# Patient Record
Sex: Male | Born: 1945 | Race: Black or African American | Hispanic: No | Marital: Married | Smoking: Never smoker
Health system: Southern US, Community
[De-identification: ages and names within clinical notes are randomized; demographics above are authoritative.]

## PROBLEM LIST (undated history)

## (undated) DIAGNOSIS — M6282 Rhabdomyolysis: Secondary | ICD-10-CM

## (undated) DIAGNOSIS — E785 Hyperlipidemia, unspecified: Secondary | ICD-10-CM

## (undated) DIAGNOSIS — I639 Cerebral infarction, unspecified: Secondary | ICD-10-CM

## (undated) DIAGNOSIS — I10 Essential (primary) hypertension: Secondary | ICD-10-CM

## (undated) DIAGNOSIS — K5730 Diverticulosis of large intestine without perforation or abscess without bleeding: Secondary | ICD-10-CM

## (undated) HISTORY — DX: Cerebral infarction, unspecified: I63.9

## (undated) HISTORY — DX: Essential (primary) hypertension: I10

## (undated) HISTORY — DX: Rhabdomyolysis: M62.82

## (undated) HISTORY — DX: Diverticulosis of large intestine without perforation or abscess without bleeding: K57.30

## (undated) HISTORY — DX: Hyperlipidemia, unspecified: E78.5

## (undated) HISTORY — PX: OTHER SURGICAL HISTORY: SHX169

---

## 2002-09-01 ENCOUNTER — Encounter: Payer: Self-pay | Admitting: Internal Medicine

## 2002-09-02 ENCOUNTER — Inpatient Hospital Stay (HOSPITAL_COMMUNITY): Admission: RE | Admit: 2002-09-02 | Discharge: 2002-09-03 | Payer: Self-pay | Admitting: Internal Medicine

## 2002-09-19 ENCOUNTER — Ambulatory Visit: Admission: RE | Admit: 2002-09-19 | Discharge: 2002-09-19 | Payer: Self-pay | Admitting: Internal Medicine

## 2002-09-19 ENCOUNTER — Encounter: Payer: Self-pay | Admitting: Cardiology

## 2002-09-20 ENCOUNTER — Ambulatory Visit (HOSPITAL_COMMUNITY): Admission: RE | Admit: 2002-09-20 | Discharge: 2002-09-20 | Payer: Self-pay | Admitting: Internal Medicine

## 2004-02-21 ENCOUNTER — Ambulatory Visit: Payer: Self-pay | Admitting: Internal Medicine

## 2004-03-24 ENCOUNTER — Ambulatory Visit: Payer: Self-pay | Admitting: Internal Medicine

## 2004-03-31 ENCOUNTER — Ambulatory Visit: Payer: Self-pay | Admitting: Internal Medicine

## 2004-08-04 ENCOUNTER — Ambulatory Visit: Payer: Self-pay | Admitting: Internal Medicine

## 2004-08-29 ENCOUNTER — Ambulatory Visit: Payer: Self-pay | Admitting: Internal Medicine

## 2004-09-26 ENCOUNTER — Ambulatory Visit: Payer: Self-pay | Admitting: Internal Medicine

## 2004-09-26 ENCOUNTER — Encounter: Payer: Self-pay | Admitting: Internal Medicine

## 2004-11-07 ENCOUNTER — Ambulatory Visit: Payer: Self-pay | Admitting: Internal Medicine

## 2004-11-26 ENCOUNTER — Ambulatory Visit: Payer: Self-pay | Admitting: Internal Medicine

## 2005-01-20 ENCOUNTER — Ambulatory Visit (HOSPITAL_COMMUNITY): Admission: RE | Admit: 2005-01-20 | Discharge: 2005-01-20 | Payer: Self-pay | Admitting: Dermatology

## 2005-01-29 ENCOUNTER — Encounter (HOSPITAL_BASED_OUTPATIENT_CLINIC_OR_DEPARTMENT_OTHER): Admission: RE | Admit: 2005-01-29 | Discharge: 2005-04-24 | Payer: Self-pay | Admitting: Surgery

## 2005-04-09 ENCOUNTER — Ambulatory Visit: Payer: Self-pay | Admitting: Internal Medicine

## 2005-04-10 ENCOUNTER — Ambulatory Visit: Payer: Self-pay | Admitting: Internal Medicine

## 2005-04-10 ENCOUNTER — Encounter: Admission: RE | Admit: 2005-04-10 | Discharge: 2005-04-10 | Payer: Self-pay | Admitting: Internal Medicine

## 2005-04-21 ENCOUNTER — Ambulatory Visit: Payer: Self-pay | Admitting: Internal Medicine

## 2005-05-01 ENCOUNTER — Encounter (HOSPITAL_BASED_OUTPATIENT_CLINIC_OR_DEPARTMENT_OTHER): Admission: RE | Admit: 2005-05-01 | Discharge: 2005-07-06 | Payer: Self-pay | Admitting: Surgery

## 2005-07-15 ENCOUNTER — Ambulatory Visit: Payer: Self-pay | Admitting: Internal Medicine

## 2005-07-24 ENCOUNTER — Ambulatory Visit (HOSPITAL_COMMUNITY): Admission: RE | Admit: 2005-07-24 | Discharge: 2005-07-24 | Payer: Self-pay | Admitting: Dermatology

## 2005-08-05 ENCOUNTER — Encounter (HOSPITAL_BASED_OUTPATIENT_CLINIC_OR_DEPARTMENT_OTHER): Admission: RE | Admit: 2005-08-05 | Discharge: 2005-10-19 | Payer: Self-pay | Admitting: Internal Medicine

## 2005-10-06 ENCOUNTER — Ambulatory Visit: Payer: Self-pay | Admitting: Internal Medicine

## 2005-10-06 ENCOUNTER — Inpatient Hospital Stay (HOSPITAL_COMMUNITY): Admission: AD | Admit: 2005-10-06 | Discharge: 2005-10-09 | Payer: Self-pay | Admitting: Internal Medicine

## 2005-10-07 ENCOUNTER — Encounter: Payer: Self-pay | Admitting: Vascular Surgery

## 2005-11-19 ENCOUNTER — Observation Stay (HOSPITAL_COMMUNITY): Admission: RE | Admit: 2005-11-19 | Discharge: 2005-11-20 | Payer: Self-pay | Admitting: Orthopedic Surgery

## 2006-11-02 ENCOUNTER — Ambulatory Visit: Payer: Self-pay | Admitting: Internal Medicine

## 2006-11-03 LAB — CONVERTED CEMR LAB
ALT: 27 units/L (ref 0–53)
AST: 38 units/L — ABNORMAL HIGH (ref 0–37)
Albumin: 4.8 g/dL (ref 3.5–5.2)
Alkaline Phosphatase: 97 units/L (ref 39–117)
BUN: 31 mg/dL — ABNORMAL HIGH (ref 6–23)
Basophils Absolute: 0 10*3/uL (ref 0.0–0.1)
Basophils Relative: 1 % (ref 0.0–1.0)
Bilirubin, Direct: 0.1 mg/dL (ref 0.0–0.3)
CO2: 26 meq/L (ref 19–32)
Calcium: 9.4 mg/dL (ref 8.4–10.5)
Chloride: 102 meq/L (ref 96–112)
Cholesterol: 134 mg/dL (ref 0–200)
Creatinine, Ser: 1.8 mg/dL — ABNORMAL HIGH (ref 0.4–1.5)
Eosinophils Absolute: 0.3 10*3/uL (ref 0.0–0.6)
Eosinophils Relative: 5.7 % — ABNORMAL HIGH (ref 0.0–5.0)
GFR calc Af Amer: 50 mL/min
GFR calc non Af Amer: 41 mL/min
Glucose, Bld: 77 mg/dL (ref 70–99)
HCT: 33.8 % — ABNORMAL LOW (ref 39.0–52.0)
HDL: 54.8 mg/dL (ref >39.0)
Hemoglobin: 11.4 g/dL — ABNORMAL LOW (ref 13.0–17.0)
LDL Cholesterol: 61 mg/dL (ref 0–99)
Lymphocytes Relative: 23.2 % (ref 12.0–46.0)
MCHC: 33.7 g/dL (ref 30.0–36.0)
MCV: 79.7 fL (ref 78.0–100.0)
Monocytes Absolute: 0.4 10*3/uL (ref 0.2–0.7)
Monocytes Relative: 8.6 % (ref 3.0–11.0)
Neutro Abs: 3.1 10*3/uL (ref 1.4–7.7)
Neutrophils Relative %: 61.5 % (ref 43.0–77.0)
Platelets: 313 10*3/uL (ref 150–400)
Potassium: 3.8 meq/L (ref 3.5–5.1)
RBC: 4.24 M/uL (ref 4.22–5.81)
RDW: 16.3 % — ABNORMAL HIGH (ref 11.5–14.6)
Sodium: 140 meq/L (ref 135–145)
Total Bilirubin: 0.7 mg/dL (ref 0.3–1.2)
Total CHOL/HDL Ratio: 2.4
Total Protein: 8.3 g/dL (ref 6.0–8.3)
Triglycerides: 90 mg/dL (ref 0–149)
VLDL: 18 mg/dL (ref 0–40)
WBC: 4.9 10*3/uL (ref 4.5–10.5)

## 2006-12-30 ENCOUNTER — Encounter: Payer: Self-pay | Admitting: Internal Medicine

## 2007-03-07 DIAGNOSIS — E785 Hyperlipidemia, unspecified: Secondary | ICD-10-CM

## 2007-03-07 DIAGNOSIS — Z8673 Personal history of transient ischemic attack (TIA), and cerebral infarction without residual deficits: Secondary | ICD-10-CM

## 2007-03-07 DIAGNOSIS — I10 Essential (primary) hypertension: Secondary | ICD-10-CM

## 2007-03-07 DIAGNOSIS — M109 Gout, unspecified: Secondary | ICD-10-CM

## 2007-07-15 ENCOUNTER — Ambulatory Visit: Payer: Self-pay | Admitting: Internal Medicine

## 2007-07-15 DIAGNOSIS — I872 Venous insufficiency (chronic) (peripheral): Secondary | ICD-10-CM

## 2007-07-27 ENCOUNTER — Encounter: Payer: Self-pay | Admitting: Internal Medicine

## 2007-07-27 ENCOUNTER — Ambulatory Visit: Payer: Self-pay | Admitting: Vascular Surgery

## 2007-08-31 ENCOUNTER — Encounter: Payer: Self-pay | Admitting: Internal Medicine

## 2007-08-31 ENCOUNTER — Ambulatory Visit: Payer: Self-pay | Admitting: Vascular Surgery

## 2007-12-11 ENCOUNTER — Ambulatory Visit: Payer: Self-pay | Admitting: Internal Medicine

## 2007-12-11 ENCOUNTER — Emergency Department (HOSPITAL_COMMUNITY): Admission: EM | Admit: 2007-12-11 | Discharge: 2007-12-11 | Payer: Self-pay | Admitting: Emergency Medicine

## 2007-12-11 ENCOUNTER — Encounter: Payer: Self-pay | Admitting: Internal Medicine

## 2007-12-14 ENCOUNTER — Encounter: Payer: Self-pay | Admitting: Internal Medicine

## 2007-12-28 ENCOUNTER — Ambulatory Visit: Payer: Self-pay | Admitting: Internal Medicine

## 2008-01-10 ENCOUNTER — Telehealth: Payer: Self-pay | Admitting: Internal Medicine

## 2008-02-17 ENCOUNTER — Ambulatory Visit (HOSPITAL_COMMUNITY): Admission: RE | Admit: 2008-02-17 | Discharge: 2008-02-17 | Payer: Self-pay | Admitting: Orthopedic Surgery

## 2009-01-01 ENCOUNTER — Inpatient Hospital Stay (HOSPITAL_COMMUNITY): Admission: EM | Admit: 2009-01-01 | Discharge: 2009-01-11 | Payer: Self-pay | Admitting: Emergency Medicine

## 2009-01-01 ENCOUNTER — Ambulatory Visit: Payer: Self-pay | Admitting: Internal Medicine

## 2009-01-08 ENCOUNTER — Ambulatory Visit: Payer: Self-pay | Admitting: Physical Medicine & Rehabilitation

## 2009-01-23 ENCOUNTER — Telehealth: Payer: Self-pay | Admitting: Internal Medicine

## 2009-01-23 ENCOUNTER — Encounter: Payer: Self-pay | Admitting: Internal Medicine

## 2009-01-25 ENCOUNTER — Ambulatory Visit: Payer: Self-pay | Admitting: Internal Medicine

## 2009-01-28 LAB — CONVERTED CEMR LAB
BUN: 11 mg/dL (ref 6–23)
CO2: 30 meq/L (ref 19–32)
Calcium: 9.1 mg/dL (ref 8.4–10.5)
Chloride: 107 meq/L (ref 96–112)
Cholesterol: 172 mg/dL (ref 0–200)
Creatinine, Ser: 1.4 mg/dL (ref 0.4–1.5)
GFR calc non Af Amer: 65.69 mL/min (ref >60)
Glucose, Bld: 90 mg/dL (ref 70–99)
HDL: 41.2 mg/dL (ref >39.00)
LDL Cholesterol: 116 mg/dL — ABNORMAL HIGH (ref 0–99)
Potassium: 3.7 meq/L (ref 3.5–5.1)
Sodium: 143 meq/L (ref 135–145)
Total CHOL/HDL Ratio: 4
Triglycerides: 76 mg/dL (ref 0.0–149.0)
Uric Acid, Serum: 9.7 mg/dL — ABNORMAL HIGH (ref 4.0–7.8)
VLDL: 15.2 mg/dL (ref 0.0–40.0)

## 2009-02-25 ENCOUNTER — Ambulatory Visit: Payer: Self-pay | Admitting: Internal Medicine

## 2009-02-26 LAB — CONVERTED CEMR LAB
ALT: 14 units/L (ref 0–53)
AST: 17 units/L (ref 0–37)
Albumin: 4.3 g/dL (ref 3.5–5.2)
Alkaline Phosphatase: 98 units/L (ref 39–117)
BUN: 17 mg/dL (ref 6–23)
Bilirubin, Direct: 0 mg/dL (ref 0.0–0.3)
CO2: 26 meq/L (ref 19–32)
Calcium: 9 mg/dL (ref 8.4–10.5)
Chloride: 104 meq/L (ref 96–112)
Cholesterol: 192 mg/dL (ref 0–200)
Creatinine, Ser: 1.5 mg/dL (ref 0.4–1.5)
GFR calc non Af Amer: 60.65 mL/min (ref >60)
Glucose, Bld: 89 mg/dL (ref 70–99)
HDL: 48.9 mg/dL (ref >39.00)
LDL Cholesterol: 125 mg/dL — ABNORMAL HIGH (ref 0–99)
Potassium: 3.8 meq/L (ref 3.5–5.1)
Sodium: 143 meq/L (ref 135–145)
TSH: 0.76 microintl units/mL (ref 0.35–5.50)
Total Bilirubin: 1.2 mg/dL (ref 0.3–1.2)
Total CHOL/HDL Ratio: 4
Total Protein: 7.6 g/dL (ref 6.0–8.3)
Triglycerides: 90 mg/dL (ref 0.0–149.0)
Uric Acid, Serum: 7.7 mg/dL (ref 4.0–7.8)
VLDL: 18 mg/dL (ref 0.0–40.0)

## 2009-04-24 ENCOUNTER — Ambulatory Visit: Payer: Self-pay | Admitting: Internal Medicine

## 2009-04-30 ENCOUNTER — Telehealth: Payer: Self-pay | Admitting: Internal Medicine

## 2009-04-30 DIAGNOSIS — K5730 Diverticulosis of large intestine without perforation or abscess without bleeding: Secondary | ICD-10-CM | POA: Insufficient documentation

## 2009-04-30 HISTORY — DX: Diverticulosis of large intestine without perforation or abscess without bleeding: K57.30

## 2009-05-08 ENCOUNTER — Ambulatory Visit: Payer: Self-pay | Admitting: Internal Medicine

## 2009-07-04 ENCOUNTER — Ambulatory Visit: Payer: Self-pay | Admitting: Internal Medicine

## 2009-09-26 ENCOUNTER — Encounter (INDEPENDENT_AMBULATORY_CARE_PROVIDER_SITE_OTHER): Payer: Self-pay | Admitting: *Deleted

## 2009-10-15 ENCOUNTER — Encounter: Payer: Self-pay | Admitting: Internal Medicine

## 2009-10-31 ENCOUNTER — Ambulatory Visit: Payer: Self-pay | Admitting: Internal Medicine

## 2009-10-31 DIAGNOSIS — N259 Disorder resulting from impaired renal tubular function, unspecified: Secondary | ICD-10-CM | POA: Insufficient documentation

## 2009-11-25 ENCOUNTER — Encounter (INDEPENDENT_AMBULATORY_CARE_PROVIDER_SITE_OTHER): Payer: Self-pay | Admitting: *Deleted

## 2010-01-15 ENCOUNTER — Ambulatory Visit: Payer: Self-pay | Admitting: Internal Medicine

## 2010-01-15 LAB — CONVERTED CEMR LAB
BUN: 16 mg/dL (ref 6–23)
CO2: 26 meq/L (ref 19–32)
Calcium: 9.3 mg/dL (ref 8.4–10.5)
Chloride: 106 meq/L (ref 96–112)
Creatinine, Ser: 1.3 mg/dL (ref 0.4–1.5)
GFR calc non Af Amer: 73.28 mL/min (ref >60)
Glucose, Bld: 91 mg/dL (ref 70–99)
Potassium: 4 meq/L (ref 3.5–5.1)
Sodium: 140 meq/L (ref 135–145)
Uric Acid, Serum: 6 mg/dL (ref 4.0–7.8)

## 2010-01-24 ENCOUNTER — Ambulatory Visit: Payer: Self-pay | Admitting: Internal Medicine

## 2010-06-10 NOTE — Letter (Signed)
Summary: Colonoscopy Letter  [facility]  [address] [place], [place] [address]   Phone: [phone]  Fax: [phone]      Sep 26, 2009 MRN: [id number]   Andre Thompson [address] [place], [place]  [address]   Dear [name],   According to your medical record, it is time for you to schedule a Colonoscopy. The American Cancer Society recommends this procedure as a method to detect early colon cancer. Patients with a family history of colon cancer, or a personal history of colon polyps or inflammatory bowel disease are at increased risk.  This letter has beeen generated based on the recommendations made at the time of your procedure. If you feel that in your particular situation this may no longer apply, please contact our office.  Please call our office at [phone] to schedule this appointment or to update your records at your earliest convenience.  Thank you for cooperating with [place] to provide you with the very best care possible.   Sincerely,  Iva Boop, M.D.  [facility] [phone]

## 2010-06-10 NOTE — Letter (Signed)
Summary: Referral - not able to see patient  [facility]  [address] [place], [place] [address]   Phone: [phone]  Fax: [phone]    November 25, 2009    [name] [address] [place], [place] [address]    Re:   Andre Thompson DOB:  30-Apr-1946 MRN:   [id number]    Dear Dr. Cato Mulligan:  Thank you for your kind referral of the above patient.  We have attempted to schedule the recommended procedure Screening Colonoscopy but have not been able to schedule because:   X  The patient was not available by phone and/or has not returned our calls.  ___ The patient declined to schedule the procedure at this time.  We appreciate the referral and hope that we will have the opportunity to treat this patient in the future.    Sincerely,    [facility] [phone]

## 2010-06-10 NOTE — Assessment & Plan Note (Signed)
Summary: 3 month rov/njr   Vital Signs:  Patient profile:   65 year old male Weight:      273 pounds Temp:     98.3 degrees F oral BP sitting:   160 / 92  (left arm) Cuff size:   regular  Vitals Entered By: Kern Reap CMA Duncan Dull) (January 24, 2010 8:50 AM)  Contraindications/Deferment of Procedures/Staging:    Test/Procedure: FLU VAX    Reason for deferment: patient declined  CC: follow-up visit Is Patient Diabetic? No Pain Assessment Patient in pain? no        CC:  follow-up visit.  History of Present Illness:  Follow-Up Visit      This is a 65 year old man who presents for Follow-up visit.  The patient denies chest pain and palpitations.  Since the last visit the patient notes no new problems or concerns.  The patient reports taking meds as prescribed.  When questioned about possible medication side effects, the patient notes none.  no recurrent gout  All other systems reviewed and were negative   Preventive Screening-Counseling & Management  Alcohol-Tobacco     Smoking Status: never  Current Medications (verified): 1)  Metoprolol Succinate 200 Mg Xr24h-Tab (Metoprolol Succinate) .... Take 1 Tablet By Mouth Once A Day 2)  Viagra 100 Mg Tabs (Sildenafil Citrate) .... 3 X Week As Directed As Needed 3)  Aspirin 81 Mg  Tbec (Aspirin) .... Once Daily 4)  Uloric 40 Mg Tabs (Febuxostat) .... Take 1 Tablet By Mouth Once A Day 5)  Amlodipine Besylate 10 Mg Tabs (Amlodipine Besylate) .... Take 1 Tablet By Mouth Once A Day 6)  Colcrys 0.6 Mg Tabs (Colchicine) .... Take 1 Tablet By Mouth Two Times A Day As Needed 7)  Micardis 80 Mg Tabs (Telmisartan) .Marland Kitchen.. 1 By Mouth Daily  Allergies (verified): 1)  ! Ace Inhibitors 2)  ! Chlorthalidone  Physical Exam  General:  alert and well-developed.   Head:  normocephalic and atraumatic.   Eyes:  pupils equal and pupils round.   Ears:  R ear normal and L ear normal.   Neck:  No deformities, masses, or tenderness noted. Chest  Wall:  No deformities, masses, tenderness or gynecomastia noted. Lungs:  normal respiratory effort and no intercostal retractions.   Heart:  normal rate and regular rhythm.   Abdomen:  Bowel sounds positive,abdomen soft and non-tender without masses, organomegaly or hernias noted. Pulses:  R radial normal and L radial normal.   Extremities:  trace LE edema bilaterally Skin:  turgor normal and color normal.     Impression & Recommendations:  Problem # 1:  HYPERTENSION (ICD-401.9)  no home BPs His updated medication list for this problem includes:    Metoprolol Succinate 200 Mg Xr24h-tab (Metoprolol succinate) .Marland Kitchen... Take 1 tablet by mouth once a day    Amlodipine Besylate 10 Mg Tabs (Amlodipine besylate) .Marland Kitchen... Take 1 tablet by mouth once a day    Micardis 80 Mg Tabs (Telmisartan) .Marland Kitchen... 1 by mouth daily  BP today: 160/92 Prior BP: 158/100 (10/31/2009)  Labs Reviewed: K+: 4.0 (01/15/2010) Creat: : 1.3 (01/15/2010)   Chol: 192 (02/25/2009)   HDL: 48.90 (02/25/2009)   LDL: 125 (02/25/2009)   TG: 90.0 (02/25/2009)  Problem # 2:  HYPERLIPIDEMIA (ICD-272.4) no need for meds Labs Reviewed: SGOT: 17 (02/25/2009)   SGPT: 14 (02/25/2009)   HDL:48.90 (02/25/2009), 41.20 (01/25/2009)  LDL:125 (02/25/2009), 116 (01/25/2009)  Chol:192 (02/25/2009), 172 (01/25/2009)  Trig:90.0 (02/25/2009), 76.0 (01/25/2009)  Problem # 3:  VENOUS INSUFFICIENCY, CHRONIC (ICD-459.81) doing well with compression stockings  Complete Medication List: 1)  Metoprolol Succinate 200 Mg Xr24h-tab (Metoprolol succinate) .... Take 1 tablet by mouth once a day 2)  Viagra 100 Mg Tabs (Sildenafil citrate) .... 3 x week as directed as needed 3)  Aspirin 81 Mg Tbec (Aspirin) .... Once daily 4)  Uloric 40 Mg Tabs (Febuxostat) .... Take 1 tablet by mouth once a day 5)  Amlodipine Besylate 10 Mg Tabs (Amlodipine besylate) .... Take 1 tablet by mouth once a day 6)  Colcrys 0.6 Mg Tabs (Colchicine) .... Take 1 tablet by mouth two  times a day as needed 7)  Micardis 80 Mg Tabs (Telmisartan) .Marland Kitchen.. 1 by mouth daily  Patient Instructions: 1)  Please schedule a follow-up appointment in 6 months. Prescriptions: MICARDIS 80 MG TABS (TELMISARTAN) 1 by mouth daily  #30 x 3   Entered and Authorized by:   [name] MD   Signed by:   [name] MD on 01/24/2010   Method used:   Electronically to        [facility]  [id number] (retail)       [address]       [place], [place]  [address]       Ph: [phone] or [phone]       Fax: [phone]   RxID:   [id number] COLCRYS 0.6 MG TABS (COLCHICINE) Take 1 tablet by mouth two times a day as needed  #30 x 3   Entered and Authorized by:   [name] MD   Signed by:   [name] MD on 01/24/2010   Method used:   Electronically to        [facility]  [address] (retail)       [address]       [place], [place]  [address]       Ph: [phone] or [phone]       Fax: [phone]   RxID:   [id number] AMLODIPINE BESYLATE 10 MG TABS (AMLODIPINE BESYLATE) Take 1 tablet by mouth once a day  #30 x 3   Entered and Authorized by:   [name] MD   Signed by:   [name] MD on 01/24/2010   Method used:   Electronically to        [facility]  [address] ([facility]       [address]       [place], [place]  [address]       Ph: [phone] or [phone]       Fax: [phone]   RxID:   [id number] ULORIC 40 MG TABS (FEBUXOSTAT) Take 1 tablet by mouth once a day  #30 x 3   Entered and Authorized by:   [name] MD   Signed by:   [name] MD on 01/24/2010   Method used:   Electronically to        [facility]  [address] [facility] ([facility]       [address]       [place], [place]  [address]       Ph: [phone] or [phone]       Fax: [phone]   RxID:   [id number] VIAGRA 100 MG TABS (SILDENAFIL CITRATE) 3 x week as directed as needed  #10 x 6  Entered and Authorized by:   [name] MD   Signed by:   [name] MD on 01/24/2010   Method used:   Electronically to        [facility]  [address] ([facility]       [address]       [place], [place]  [address]       Ph: [phone] or [phone]       Fax: [phone]   RxID:   [id number] METOPROLOL SUCCINATE 200 MG XR24H-TAB (METOPROLOL SUCCINATE) Take 1 tablet by mouth once a day  #30 x 3   Entered and Authorized by:   [name] MD   Signed by:   [name] MD on 01/24/2010   Method used:   [facility]ically to        [facility]  [address] [facility]       [address]       [place], [place]  [address]       Ph: [phone] or [phone]       Fax: [phone]   RxID:   [id number] MICARDIS 80 MG TABS (TELMISARTAN) 1 by mouth daily  #90 x 3   Entered and Authorized by:   [name] MD   Signed by:   [name] MD on 01/24/2010   Method used:   Electronically to        [facility]* (mail-order)       [address] [place], [place]  [address]       Ph: [phone]       Fax: [phone]   RxID:   [id number] COLCRYS 0.6 MG TABS (COLCHICINE) Take 1 tablet by mouth two times a day as needed  #90 x 3   Entered and Authorized by:   [name] MD   Signed by:   [name] MD on 01/24/2010   Method used:   Electronically to        [facility]* (mail-order)       [address] [place], [place]  [address]       Ph: [phone]       Fax: [phone]   RxID:   [id number] AMLODIPINE BESYLATE 10 MG TABS (AMLODIPINE BESYLATE) Take 1 tablet by mouth once a day  #90 x 3   Entered and Authorized by:   [name] MD   Signed by:   [name] MD on 01/24/2010   Method used:   Electronically to        [facility]* (mail-order)       [address] [place], [place]  [address]       Ph: [phone]       Fax: [phone]   RxID:    [id number] ULORIC 40 MG TABS (FEBUXOSTAT) Take 1 tablet by mouth once a day  #90 x 3   Entered and Authorized by:   [name] MD   Signed by:   [name] MD on 01/24/2010   Method used:   Electronically to        [facility]* (mail-order)       [address] [place], [place]  [address]       Ph: [phone]  Fax: [phone]   RxID:   [id number] METOPROLOL SUCCINATE 200 MG XR24H-TAB (METOPROLOL SUCCINATE) Take 1 tablet by mouth once a day  #90 x 3   Entered and Authorized by:   [name] MD   Signed by:   [name] MD on 01/24/2010   Method used:   Electronically to        [facility]* (mail-order)       [address] [place], [place]  [address]       Ph: [phone]       Fax: [phone]   RxID:   [id number]

## 2010-06-10 NOTE — Assessment & Plan Note (Signed)
Summary: 4 month rov/njr rsc bmp/njr   Vital Signs:  Patient profile:   65 year old male Weight:      278 pounds BMI:     34.87 Pulse rate:   64 / minute Pulse rhythm:   regular Resp:     12 per minute BP sitting:   158 / 100  (left arm) Cuff size:   regular  Vitals Entered By: Gladis Riffle, RN (October 31, 2009 8:34 AM) CC: 4 month rov, brought labs--still taking micardis until runs out and then will take losartan Is Patient Diabetic? No Comments does not check BP at home   CC:  4 month rov and brought labs--still taking micardis until runs out and then will take losartan.  History of Present Illness:  Follow-Up Visit      This is a 65 year old man who presents for Follow-up visit.  The patient denies chest pain and palpitations.  Since the last visit the patient notes no new problems or concerns.  The patient reports taking meds as prescribed (still on micardis---never had losartan filled).  When questioned about possible medication side effects, the patient notes none.    Preventive Screening-Counseling & Management  Alcohol-Tobacco     Smoking Status: never  Current Medications (verified): 1)  Labetalol Hcl 200 Mg Tabs (Labetalol Hcl) .... Take 1 Tablet By Mouth Once A Day 2)  Viagra 100 Mg Tabs (Sildenafil Citrate) .... 3 X Week As Directed As Needed 3)  Aspirin 81 Mg  Tbec (Aspirin) .... Once Daily 4)  Uloric 40 Mg Tabs (Febuxostat) .... Take 1 Tablet By Mouth Once A Day 5)  Amlodipine Besylate 10 Mg Tabs (Amlodipine Besylate) .... Take 1 Tablet By Mouth Once A Day 6)  Colcrys 0.6 Mg Tabs (Colchicine) .... Take 1 Tablet By Mouth Two Times A Day As Needed 7)  Micardis 80 Mg Tabs (Telmisartan) .Marland Kitchen.. 1 By Mouth Daily  Allergies: 1)  ! Ace Inhibitors 2)  ! Chlorthalidone  Past History:  Past Medical History: Last updated: 01/25/2009 Gout Hyperlipidemia Hypertension Cerebrovascular accident, hx of chronic venous insuff ulcers (has been evaluated for other  etiologies---pyoderma gangrenosum) rhabdomyolysis with acute renal failure (2010)  Past Surgical History: Last updated: 03/07/2007 Colonoscopy  Family History: Last updated: 03/07/2007 Family History of Alcoholism/Addiction Family History Hypertension  Social History: Last updated: 03/07/2007 Occupation: Married Never Smoked Alcohol use-yes  Risk Factors: Smoking Status: never (10/31/2009)  Physical Exam  General:  Well-developed,well-nourished,in no acute distress; alert,appropriate and cooperative throughout examination Head:  normocephalic and atraumatic.   Eyes:  pupils equal and pupils round.   Ears:  R ear normal and L ear normal.   Neck:  No deformities, masses, or tenderness noted. Chest Wall:  No deformities, masses, tenderness or gynecomastia noted. Lungs:  normal respiratory effort and no intercostal retractions.   Heart:  normal rate and regular rhythm.   Abdomen:  Bowel sounds positive,abdomen soft and non-tender without masses, organomegaly or hernias noted. Msk:  no joint swelling or erythema Extremities:  trace left pedal edema and trace right pedal edema.   Skin:  no lower extremity ulcders   Impression & Recommendations:  Problem # 1:  GOUT (ICD-274.9)  His updated medication list for this problem includes:    Uloric 40 Mg Tabs (Febuxostat) .Marland Kitchen... Take 1 tablet by mouth once a day    Colcrys 0.6 Mg Tabs (Colchicine) .Marland Kitchen... Take 1 tablet by mouth two times a day as needed  Problem # 2:  HYPERTENSION (ICD-401.9)  Bp elevated was elevated at "work physical" as well His updated medication list for this problem includes:    Metoprolol Succinate 200 Mg Xr24h-tab (Metoprolol succinate) .Marland Kitchen... Take 1 tablet by mouth once a day    Amlodipine Besylate 10 Mg Tabs (Amlodipine besylate) .Marland Kitchen... Take 1 tablet by mouth once a day    Micardis 80 Mg Tabs (Telmisartan) .Marland Kitchen... 1 by mouth daily  BP today: 158/100 Prior BP: 150/90 (07/04/2009)  Labs Reviewed: K+: 3.8  (02/25/2009) Creat: : 1.5 (02/25/2009)   Chol: 192 (02/25/2009)   HDL: 48.90 (02/25/2009)   LDL: 125 (02/25/2009)   TG: 90.0 (02/25/2009)  Problem # 3:  HYPERLIPIDEMIA (ICD-272.4) reviewed labs from [facility] Labs Reviewed: SGOT: 17 (02/25/2009)   SGPT: 14 (02/25/2009)   HDL:48.90 (02/25/2009), 41.20 (01/25/2009)  LDL:125 (02/25/2009), 116 (01/25/2009)  Chol:192 (02/25/2009), 172 (01/25/2009)  Trig:90.0 (02/25/2009), 76.0 (01/25/2009)  Problem # 4:  VENOUS INSUFFICIENCY, CHRONIC (ICD-459.81) stockings  Complete Medication List: 1)  Metoprolol Succinate 200 Mg Xr24h-tab (Metoprolol succinate) .... Take 1 tablet by mouth once a day 2)  Viagra 100 Mg Tabs (Sildenafil citrate) .... 3 x week as directed as needed 3)  Aspirin 81 Mg Tbec (Aspirin) .... Once daily 4)  Uloric 40 Mg Tabs (Febuxostat) .... Take 1 tablet by mouth once a day 5)  Amlodipine Besylate 10 Mg Tabs (Amlodipine besylate) .... Take 1 tablet by mouth once a day 6)  Colcrys 0.6 Mg Tabs (Colchicine) .... Take 1 tablet by mouth two times a day as needed 7)  Micardis 80 Mg Tabs (Telmisartan) .Marland Kitchen.. 1 by mouth daily  Patient Instructions: 1)  Please schedule a follow-up appointment in 3 months. 2)  bmet 3)  uric acid Prescriptions: METOPROLOL SUCCINATE 200 MG XR24H-TAB (METOPROLOL SUCCINATE) Take 1 tablet by mouth once a day  #90 x 3   Entered and Authorized by:   [name] MD   Signed by:   [name] MD on 10/31/2009   Method used:   Electronically to        [facility]* (mail-order)       [address] [place], [place]  [address]       Ph: [phone]       Fax: [phone]   RxID:   [id number] METOPROLOL SUCCINATE 200 MG XR24H-TAB (METOPROLOL SUCCINATE) Take 1 tablet by mouth once a day  #90 x 3   Entered and Authorized by:   [name] MD   Signed by:   [name] MD on 10/31/2009   Method used:   [facility] to        [facility]  [address] [facility]       [address]        [place], [place]  [address]       Ph: [phone] or [phone]       Fax: [phone]   RxID:   [id number]

## 2010-06-10 NOTE — Assessment & Plan Note (Signed)
Summary: 2 month rov/njr   Vital Signs:  Patient profile:   65 year old male Height:      75 inches Weight:      260 pounds BMI:     32.62 Temp:     98.1 degrees F oral Pulse rate:   64 / minute Resp:     14 per minute BP sitting:   150 / 90  (left arm)  Vitals Entered By: Willy Eddy, LPN (July 04, 2009 8:04 AM)  Nutrition Counseling: Patient's BMI is greater than 25 and therefore counseled on weight management options. CC: roa-   CC:  roa-.  History of Present Illness:  Follow-Up Visit      This is a 65 year old man who presents for Follow-up visit.  The patient denies chest pain and palpitations.  Since the last visit the patient notes no new problems or concerns.  The patient reports taking meds as prescribed.  When questioned about possible medication side effects, the patient notes none.    All other systems reviewed and were negative   Preventive Screening-Counseling & Management  Alcohol-Tobacco     Smoking Status: never  Current Problems (verified): 1)  Diverticulosis, Colon  (ICD-562.10) 2)  Venous Insufficiency, Chronic  (ICD-459.81) 3)  Cerebrovascular Accident, Hx of  (ICD-V12.50) 4)  Hypertension  (ICD-401.9) 5)  Hyperlipidemia  (ICD-272.4) 6)  Gout  (ICD-274.9)  Current Medications (verified): 1)  Labetalol Hcl 200 Mg Tabs (Labetalol Hcl) .... Take 1 Tablet By Mouth Once A Day 2)  Micardis 80 Mg Tabs (Telmisartan) .Marland Kitchen.. 1 By Mouth Daily 3)  Viagra 100 Mg Tabs (Sildenafil Citrate) .... 3 X Week As Directed As Needed 4)  Aspirin 81 Mg  Tbec (Aspirin) .... Once Daily 5)  Uloric 40 Mg Tabs (Febuxostat) .... Take 1 Tablet By Mouth Once A Day 6)  Amlodipine Besylate 10 Mg Tabs (Amlodipine Besylate) .... Take 1 Tablet By Mouth Once A Day 7)  Colcrys 0.6 Mg Tabs (Colchicine) .Marland Kitchen.. 1 Once Daily  Allergies (verified): 1)  ! Ace Inhibitors 2)  ! Chlorthalidone  Past History:  Past Medical History: Last updated:  01/25/2009 Gout Hyperlipidemia Hypertension Cerebrovascular accident, hx of chronic venous insuff ulcers (has been evaluated for other etiologies---pyoderma gangrenosum) rhabdomyolysis with acute renal failure (2010)  Past Surgical History: Last updated: 03/07/2007 Colonoscopy  Family History: Last updated: 03/07/2007 Family History of Alcoholism/Addiction Family History Hypertension  Social History: Last updated: 03/07/2007 Occupation: Married Never Smoked Alcohol use-yes  Risk Factors: Smoking Status: never (07/04/2009)  Review of Systems       All other systems reviewed and were negative   Physical Exam  General:  Well-developed,well-nourished,in no acute distress; alert,appropriate and cooperative throughout examination Head:  normocephalic and atraumatic.   Eyes:  pupils equal and pupils round.   Ears:  R ear normal and L ear normal.   Nose:  no external deformity and no external erythema.   Neck:  No deformities, masses, or tenderness noted. Lungs:  Normal respiratory effort, chest expands symmetrically. Lungs are clear to auscultation, no crackles or wheezes. Heart:  Normal rate and regular rhythm. S1 and S2 normal without gallop, murmur, click, rub or other extra sounds. Abdomen:  Bowel sounds positive,abdomen soft and non-tender without masses, organomegaly or hernias noted. Msk:  no joint swelling or erythema Pulses:  R radial normal and L radial normal.   Neurologic:  cranial nerves II-XII intact and gait normal.   Skin:  lower extremities with chronic venous insuff  2 cm ulcer left lateral maleolus---surrounding ridge signs of excoriation Psych:  normally interactive and good eye contact.     Impression & Recommendations:  Problem # 1:  HYPERTENSION (ICD-401.9) change meds based on cost he will monitor BP The following medications were removed from the medication list:    Micardis 80 Mg Tabs (Telmisartan) .Marland Kitchen... 1 by mouth daily His updated  medication list for this problem includes:    Labetalol Hcl 200 Mg Tabs (Labetalol hcl) .Marland Kitchen... Take 1 tablet by mouth once a day    Amlodipine Besylate 10 Mg Tabs (Amlodipine besylate) .Marland Kitchen... Take 1 tablet by mouth once a day    Losartan Potassium 100 Mg Tabs (Losartan potassium) .Marland Kitchen... Take 1 tablet by mouth once a day  Problem # 2:  HYPERLIPIDEMIA (ICD-272.4) no meds currently Labs Reviewed: SGOT: 17 (02/25/2009)   SGPT: 14 (02/25/2009)   HDL:48.90 (02/25/2009), 41.20 (01/25/2009)  LDL:125 (02/25/2009), 116 (01/25/2009)  Chol:192 (02/25/2009), 172 (01/25/2009)  Trig:90.0 (02/25/2009), 76.0 (01/25/2009)  Problem # 3:  GOUT (ICD-274.9) no recurrence His updated medication list for this problem includes:    Uloric 40 Mg Tabs (Febuxostat) .Marland Kitchen... Take 1 tablet by mouth once a day    Colchicine 0.6 Mg Tabs (Colchicine) .Marland Kitchen... Take 1 tablet by mouth once a day  Problem # 4:  VENOUS INSUFFICIENCY, CHRONIC (ICD-459.81) legs are remarkably well controlled  Complete Medication List: 1)  Labetalol Hcl 200 Mg Tabs (Labetalol hcl) .... Take 1 tablet by mouth once a day 2)  Viagra 100 Mg Tabs (Sildenafil citrate) .... 3 x week as directed as needed 3)  Aspirin 81 Mg Tbec (Aspirin) .... Once daily 4)  Uloric 40 Mg Tabs (Febuxostat) .... Take 1 tablet by mouth once a day 5)  Amlodipine Besylate 10 Mg Tabs (Amlodipine besylate) .... Take 1 tablet by mouth once a day 6)  Colchicine 0.6 Mg Tabs (Colchicine) .... Take 1 tablet by mouth once a day 7)  Losartan Potassium 100 Mg Tabs (Losartan potassium) .... Take 1 tablet by mouth once a day  Patient Instructions: 1)  Please schedule a follow-up appointment in 4 months. Prescriptions: AMLODIPINE BESYLATE 10 MG TABS (AMLODIPINE BESYLATE) Take 1 tablet by mouth once a day  #90 x 3   Entered and Authorized by:   [name] MD   Signed by:   [name] MD on 07/04/2009   Method used:   Print then Give to Patient   RxID:   [id number] ULORIC 40 MG  TABS (FEBUXOSTAT) Take 1 tablet by mouth once a day  #90 x 3   Entered and Authorized by:   [name] MD   Signed by:   [name] MD on 07/04/2009   Method used:   Print then Give to Patient   RxID:   [id number] LABETALOL HCL 200 MG TABS (LABETALOL HCL) Take 1 tablet by mouth once a day  #180 x 3   Entered and Authorized by:   [name] MD   Signed by:   [name] MD on 07/04/2009   Method used:   Print then Give to Patient   RxID:   [id number] COLCHICINE 0.6 MG TABS (COLCHICINE) Take 1 tablet by mouth once a day  #90 x 3   Entered and Authorized by:   [name] MD   Signed by:   [name] MD on 07/04/2009   Method used:   Print then Give to Patient   RxID:   [id number] LOSARTAN POTASSIUM 100 MG TABS (LOSARTAN  POTASSIUM) Take 1 tablet by mouth once a day  #90 x 3   Entered and Authorized by:   [name] MD   Signed by:   [name] MD on 07/04/2009   Method used:   Print then Give to Patient   RxID:   [id number]

## 2010-07-25 ENCOUNTER — Ambulatory Visit: Payer: Self-pay | Admitting: Internal Medicine

## 2010-08-15 LAB — BASIC METABOLIC PANEL
BUN: 33 mg/dL — ABNORMAL HIGH (ref 6–23)
BUN: 36 mg/dL — ABNORMAL HIGH (ref 6–23)
BUN: 38 mg/dL — ABNORMAL HIGH (ref 6–23)
CO2: 24 mEq/L (ref 19–32)
CO2: 25 mEq/L (ref 19–32)
CO2: 27 mEq/L (ref 19–32)
Calcium: 8.3 mg/dL — ABNORMAL LOW (ref 8.4–10.5)
Calcium: 8.3 mg/dL — ABNORMAL LOW (ref 8.4–10.5)
Calcium: 8.3 mg/dL — ABNORMAL LOW (ref 8.4–10.5)
Chloride: 103 mEq/L (ref 96–112)
Chloride: 104 mEq/L (ref 96–112)
Chloride: 106 mEq/L (ref 96–112)
Creatinine, Ser: 1.27 mg/dL (ref 0.4–1.5)
Creatinine, Ser: 1.27 mg/dL (ref 0.4–1.5)
Creatinine, Ser: 1.5 mg/dL (ref 0.4–1.5)
GFR calc Af Amer: 57 mL/min — ABNORMAL LOW (ref >60)
GFR calc Af Amer: >60 mL/min (ref >60)
GFR calc Af Amer: >60 mL/min (ref >60)
GFR calc non Af Amer: 47 mL/min — ABNORMAL LOW (ref >60)
GFR calc non Af Amer: 57 mL/min — ABNORMAL LOW (ref >60)
GFR calc non Af Amer: 57 mL/min — ABNORMAL LOW (ref >60)
Glucose, Bld: 180 mg/dL — ABNORMAL HIGH (ref 70–99)
Glucose, Bld: 84 mg/dL (ref 70–99)
Glucose, Bld: 89 mg/dL (ref 70–99)
Potassium: 3.8 mEq/L (ref 3.5–5.1)
Potassium: 3.9 mEq/L (ref 3.5–5.1)
Potassium: 4.5 mEq/L (ref 3.5–5.1)
Sodium: 134 mEq/L — ABNORMAL LOW (ref 135–145)
Sodium: 136 mEq/L (ref 135–145)
Sodium: 138 mEq/L (ref 135–145)

## 2010-08-15 LAB — CBC
HCT: 34.5 % — ABNORMAL LOW (ref 39.0–52.0)
Hemoglobin: 11.7 g/dL — ABNORMAL LOW (ref 13.0–17.0)
MCHC: 33.8 g/dL (ref 30.0–36.0)
MCV: 94.7 fL (ref 78.0–100.0)
Platelets: 331 10*3/uL (ref 150–400)
RBC: 3.65 MIL/uL — ABNORMAL LOW (ref 4.22–5.81)
RDW: 15.1 % (ref 11.5–15.5)
WBC: 20.5 10*3/uL — ABNORMAL HIGH (ref 4.0–10.5)

## 2010-08-16 LAB — COMPREHENSIVE METABOLIC PANEL
ALT: 92 U/L — ABNORMAL HIGH (ref 0–53)
AST: 162 U/L — ABNORMAL HIGH (ref 0–37)
Albumin: 3.3 g/dL — ABNORMAL LOW (ref 3.5–5.2)
Alkaline Phosphatase: 92 U/L (ref 39–117)
BUN: 101 mg/dL — ABNORMAL HIGH (ref 6–23)
CO2: 21 mEq/L (ref 19–32)
CO2: 22 mEq/L (ref 19–32)
Calcium: 8.7 mg/dL (ref 8.4–10.5)
Chloride: 113 mEq/L — ABNORMAL HIGH (ref 96–112)
Creatinine, Ser: 4.13 mg/dL — ABNORMAL HIGH (ref 0.4–1.5)
Creatinine, Ser: 4.48 mg/dL — ABNORMAL HIGH (ref 0.4–1.5)
GFR calc Af Amer: 18 mL/min — ABNORMAL LOW (ref >60)
GFR calc non Af Amer: 13 mL/min — ABNORMAL LOW (ref >60)
GFR calc non Af Amer: 15 mL/min — ABNORMAL LOW (ref >60)
Glucose, Bld: 157 mg/dL — ABNORMAL HIGH (ref 70–99)
Potassium: 3.5 mEq/L (ref 3.5–5.1)
Total Bilirubin: 2.9 mg/dL — ABNORMAL HIGH (ref 0.3–1.2)

## 2010-08-16 LAB — LIPID PANEL
Total CHOL/HDL Ratio: 6.6 RATIO
Triglycerides: 83 mg/dL (ref <150)
VLDL: 17 mg/dL (ref 0–40)

## 2010-08-16 LAB — URINALYSIS, ROUTINE W REFLEX MICROSCOPIC
Glucose, UA: NEGATIVE mg/dL
Protein, ur: 100 mg/dL — AB
pH: 5 (ref 5.0–8.0)

## 2010-08-16 LAB — BASIC METABOLIC PANEL
BUN: 100 mg/dL — ABNORMAL HIGH (ref 6–23)
BUN: 50 mg/dL — ABNORMAL HIGH (ref 6–23)
BUN: 54 mg/dL — ABNORMAL HIGH (ref 6–23)
BUN: 75 mg/dL — ABNORMAL HIGH (ref 6–23)
BUN: 98 mg/dL — ABNORMAL HIGH (ref 6–23)
CO2: 25 mEq/L (ref 19–32)
Calcium: 8.2 mg/dL — ABNORMAL LOW (ref 8.4–10.5)
Chloride: 104 mEq/L (ref 96–112)
Chloride: 105 mEq/L (ref 96–112)
Chloride: 106 mEq/L (ref 96–112)
Chloride: 112 mEq/L (ref 96–112)
Creatinine, Ser: 1.44 mg/dL (ref 0.4–1.5)
Creatinine, Ser: 2.1 mg/dL — ABNORMAL HIGH (ref 0.4–1.5)
Creatinine, Ser: 3.34 mg/dL — ABNORMAL HIGH (ref 0.4–1.5)
Creatinine, Ser: 4.34 mg/dL — ABNORMAL HIGH (ref 0.4–1.5)
GFR calc non Af Amer: 14 mL/min — ABNORMAL LOW (ref >60)
GFR calc non Af Amer: 32 mL/min — ABNORMAL LOW (ref >60)
GFR calc non Af Amer: 50 mL/min — ABNORMAL LOW (ref >60)
Glucose, Bld: 136 mg/dL — ABNORMAL HIGH (ref 70–99)
Glucose, Bld: 146 mg/dL — ABNORMAL HIGH (ref 70–99)
Glucose, Bld: 163 mg/dL — ABNORMAL HIGH (ref 70–99)
Potassium: 3.2 mEq/L — ABNORMAL LOW (ref 3.5–5.1)
Potassium: 3.5 mEq/L (ref 3.5–5.1)
Potassium: 4.3 mEq/L (ref 3.5–5.1)

## 2010-08-16 LAB — CK
Total CK: 128 U/L (ref 7–232)
Total CK: 685 U/L — ABNORMAL HIGH (ref 7–232)
Total CK: 956 U/L — ABNORMAL HIGH (ref 7–232)

## 2010-08-16 LAB — DIFFERENTIAL
Basophils Absolute: 0 10*3/uL (ref 0.0–0.1)
Basophils Absolute: 0 10*3/uL (ref 0.0–0.1)
Basophils Absolute: 0 10*3/uL (ref 0.0–0.1)
Basophils Relative: 0 % (ref 0–1)
Basophils Relative: 0 % (ref 0–1)
Eosinophils Absolute: 0 10*3/uL (ref 0.0–0.7)
Eosinophils Relative: 0 % (ref 0–5)
Lymphocytes Relative: 4 % — ABNORMAL LOW (ref 12–46)
Lymphs Abs: 0.8 10*3/uL (ref 0.7–4.0)
Lymphs Abs: 0.8 10*3/uL (ref 0.7–4.0)
Monocytes Absolute: 0.6 10*3/uL (ref 0.1–1.0)
Monocytes Absolute: 1.5 10*3/uL — ABNORMAL HIGH (ref 0.1–1.0)
Monocytes Relative: 2 % — ABNORMAL LOW (ref 3–12)
Monocytes Relative: 3 % (ref 3–12)
Neutro Abs: 18.5 10*3/uL — ABNORMAL HIGH (ref 1.7–7.7)
Neutrophils Relative %: 94 % — ABNORMAL HIGH (ref 43–77)

## 2010-08-16 LAB — URIC ACID: Uric Acid, Serum: 14.6 mg/dL — ABNORMAL HIGH (ref 4.0–7.8)

## 2010-08-16 LAB — CBC
HCT: 38.1 % — ABNORMAL LOW (ref 39.0–52.0)
HCT: 38.9 % — ABNORMAL LOW (ref 39.0–52.0)
HCT: 42.6 % (ref 39.0–52.0)
Hemoglobin: 13.1 g/dL (ref 13.0–17.0)
Hemoglobin: 14.5 g/dL (ref 13.0–17.0)
MCHC: 33.7 g/dL (ref 30.0–36.0)
MCHC: 34 g/dL (ref 30.0–36.0)
MCHC: 34.5 g/dL (ref 30.0–36.0)
MCV: 94.2 fL (ref 78.0–100.0)
MCV: 95.8 fL (ref 78.0–100.0)
MCV: 96.1 fL (ref 78.0–100.0)
Platelets: 234 10*3/uL (ref 150–400)
Platelets: 256 10*3/uL (ref 150–400)
Platelets: 283 10*3/uL (ref 150–400)
RBC: 4.45 MIL/uL (ref 4.22–5.81)
RDW: 14.9 % (ref 11.5–15.5)
RDW: 15.1 % (ref 11.5–15.5)
RDW: 15.6 % — ABNORMAL HIGH (ref 11.5–15.5)
WBC: 16.5 10*3/uL — ABNORMAL HIGH (ref 4.0–10.5)
WBC: 20.8 10*3/uL — ABNORMAL HIGH (ref 4.0–10.5)

## 2010-08-16 LAB — URINE MICROSCOPIC-ADD ON

## 2010-08-16 LAB — CK TOTAL AND CKMB (NOT AT ARMC): Relative Index: 0.6 (ref 0.0–2.5)

## 2010-08-16 LAB — CULTURE, BLOOD (ROUTINE X 2)
Culture: NO GROWTH
Culture: NO GROWTH

## 2010-08-16 LAB — URINE CULTURE

## 2010-08-16 LAB — ANA: Anti Nuclear Antibody(ANA): NEGATIVE

## 2010-08-16 LAB — RHEUMATOID FACTOR: Rhuematoid fact SerPl-aCnc: 23 IU/mL — ABNORMAL HIGH (ref 0–20)

## 2010-08-16 LAB — COMPLEMENT, TOTAL: Compl, Total (CH50): 59 U/mL (ref 31–60)

## 2010-08-26 ENCOUNTER — Encounter: Payer: Self-pay | Admitting: Internal Medicine

## 2010-08-28 ENCOUNTER — Encounter: Payer: Self-pay | Admitting: Internal Medicine

## 2010-08-28 ENCOUNTER — Ambulatory Visit (INDEPENDENT_AMBULATORY_CARE_PROVIDER_SITE_OTHER): Payer: Managed Care, Other (non HMO) | Admitting: Internal Medicine

## 2010-08-28 DIAGNOSIS — Z23 Encounter for immunization: Secondary | ICD-10-CM

## 2010-08-28 DIAGNOSIS — N259 Disorder resulting from impaired renal tubular function, unspecified: Secondary | ICD-10-CM

## 2010-08-28 DIAGNOSIS — Z2911 Encounter for prophylactic immunotherapy for respiratory syncytial virus (RSV): Secondary | ICD-10-CM

## 2010-08-28 DIAGNOSIS — I10 Essential (primary) hypertension: Secondary | ICD-10-CM

## 2010-08-28 DIAGNOSIS — I872 Venous insufficiency (chronic) (peripheral): Secondary | ICD-10-CM

## 2010-08-28 DIAGNOSIS — E785 Hyperlipidemia, unspecified: Secondary | ICD-10-CM

## 2010-08-28 LAB — LIPID PANEL
HDL: 42.4 mg/dL (ref >39.00)
Total CHOL/HDL Ratio: 5
Triglycerides: 208 mg/dL — ABNORMAL HIGH (ref 0.0–149.0)
VLDL: 41.6 mg/dL — ABNORMAL HIGH (ref 0.0–40.0)

## 2010-08-28 LAB — BASIC METABOLIC PANEL
CO2: 28 mEq/L (ref 19–32)
Calcium: 9.6 mg/dL (ref 8.4–10.5)
Sodium: 141 mEq/L (ref 135–145)

## 2010-08-28 LAB — HEPATIC FUNCTION PANEL
Alkaline Phosphatase: 92 U/L (ref 39–117)
Bilirubin, Direct: 0.1 mg/dL (ref 0.0–0.3)
Total Bilirubin: 1 mg/dL (ref 0.3–1.2)
Total Protein: 8.2 g/dL (ref 6.0–8.3)

## 2010-08-28 LAB — LDL CHOLESTEROL, DIRECT: Direct LDL: 143.1 mg/dL

## 2010-08-28 MED ORDER — SPIRONOLACTONE 50 MG PO TABS: 50.0000 mg | ORAL_TABLET | Freq: Every day | ORAL | Status: DC

## 2010-08-28 NOTE — Assessment & Plan Note (Addendum)
Not controlled He needs to monitor at home Add spironolacton

## 2010-08-28 NOTE — Progress Notes (Signed)
  Subjective:    Patient ID: Andre Thompson, male    DOB: 1945/05/27, 65 y.o.   MRN: [id number]  HPI  Patient comes in for followup of multiple medical problems including hypertension, chronic venous insufficiency, history of renal insufficiency, hyperlipidemia, gout and history of a stroke (mild). Patient has been feeling well. He has not had any recurrent trouble with venous insufficiency ulcers. He is wearing compression stockings.  In regards to hypertension he has not had any headache, neurologic deficits. He is taking his medications.  Hyperlipidemia patient is currently not taking any medications.  Doubt patient has not had any recurrence. In the past he has been on chlorthalidone which likely contributed to gout.  Past Medical History  Diagnosis Date  . Gout   . Hyperlipidemia   . Hypertension   . Stroke   . Rhabdomyolysis    No past surgical history on file.  reports that he has never smoked. He does not have any smokeless tobacco history on file. His alcohol and drug histories not on file. family history includes Cancer in his brother and Hypertension in his brother, father, mother, and sister. Allergies  Allergen Reactions  . Ace Inhibitors     REACTION: angioedema--8/09 (on lotrel)  . Chlorthalidone     REACTION: gout     Review of Systems    patient denies chest pain, shortness of breath, orthopnea. Denies lower extremity edema, abdominal pain, change in appetite, change in bowel movements. Patient denies rashes, musculoskeletal complaints. No other specific complaints in a complete review of systems.    Objective:   Physical Exam Overweight male in no acute distress. HEENT exam atraumatic, normocephalic, neck supple without jugular venous distention. Chest is clear to auscultation without increased work of breathing. Cardiac exam S1 and S2 are normal. I don't hear a gallop. Abdominal exam overweight, bowel sounds, soft. Extremities there is no significant edema. He  is wearing compression stockings. Neurologic exam is alert. Gait normal.       Assessment & Plan:

## 2010-08-28 NOTE — Assessment & Plan Note (Signed)
Has been stable check today

## 2010-08-28 NOTE — Assessment & Plan Note (Signed)
Needs f/u Check labs today 

## 2010-08-28 NOTE — Assessment & Plan Note (Signed)
Wearing compression stockings Doing much better

## 2010-09-23 NOTE — H&P (Signed)
NAMEARRION, BROADDUS NO.:  [id number]   MEDICAL RECORD NO.:  [id number]          PATIENT TYPE:  EMS   LOCATION:  ED                           FACILITY:  [facility]   PHYSICIAN:  Raenette Rover. Felicity Coyer, MDDATE OF BIRTH:  [birth year]   DATE OF ADMISSION:  01/01/2009  DATE OF DISCHARGE:                              HISTORY & PHYSICAL   PRIMARY CARE PHYSICIAN:  Valetta Mole. Swords, MD.   CHIEF COMPLAINT:  Fall and unable to get up.   HISTORY OF PRESENT ILLNESS:  The patient is a 65 year old gentleman with  history of chronic gout, hypertension and stroke who was brought to the  emergency room today by EMS.  The patient reports that he developed  typical gout symptoms in his bilateral upper extremities from the elbows  down to his hands 5 days ago on Friday.  He describes this as severe  pain and swelling of the large joints in his hands with involvement of  the fingers and elbows, minimally in the wrist.  This is associated with  generalized malaise and difficulty with activities of daily living due  to affecting both hands.  The symptoms persisted and increased in  severity of pain despite use of colchicine at home to the point that the  patient could not use his upper extremities to push himself into an  upright position from bed.  He reports the weekend days blend together  as his memory for this time is foggy and is unsure if he got out of  bed to eat and drink.  He does recall that on Sunday, 3 days ago, when  he was attempting to get out of bed his arms gave way, he slid to the  floor and could not get up or answer the phone because of severe pain in  his hands and elbows.  This was associated with incontinence of bladder  over this time.  Because he did not show up for work for the last 2 days  his employers called EMS who arrived to find the patient on the ground  unkempt and quite dehydrated.  He was sent to the emergency room for  further evaluation where lab testing  revealed acute renal insufficiency  with rhabdomyolysis and leukocytosis.  At this time the patient's only  complaint is that of pain in his hands and elbow and left knee.  He  denies any headache, cough, abdominal pain or preceding nausea, vomiting  or diarrhea.   REVIEW OF SYSTEMS:  Please see HPI above.  CONSTITUTIONAL:  Positive  malaise for 5 days.  No fever or chills.  PSYCH:  No depression.  NEURO:  No headaches.  CHEST:  No cough or shortness of breath.  CARDIOVASCULAR:  No chest pain or palpitations.  ABDOMEN:  No nausea, vomiting or  diarrhea.  No abdominal pain.  MUSCULOSKELETAL:  Gout symptoms as  described above in bilateral hands, elbows and left knee, not affecting  joints in the feet, ankles.  SKIN:  No rashes.  GU:  No dysuria or  hematuria.  All other systems reviewed and are  negative.   SOCIAL HISTORY:  He does not smoke or drink.  He lives alone.  He works  as an Museum/gallery curator.   FAMILY HISTORY:  Reviewed and noncontributory.   PAST MEDICAL HISTORY:  1. Significant for chronic venous stasis ulcers of the bilateral lower      extremity.  2. Angioedema while on ACE inhibitor.  3. 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  4. Normocytic chronic anemia.  5. Mild chronic renal insufficiency with baseline creatinine of 1.5 in      August 2009.  6. Hypertension.  7. Gout.  8. History of dyslipidemia not on medications.   CURRENT MEDICATIONS:  According to primary care physician EMR are as  follows:  1. Labetalol 200 mg daily.  2. Micardis HCT 80/12.5 daily.  3. Loratadine 10 mg daily.  4. Aspirin 81 mg daily.  5. Viagra p.r.n.  6. Allopurinol 300 mg daily.  7. Colchicine 0.6 mg b.i.d. p.r.n. gout.  8. Amlodipine 10 mg daily.  9. Vicodin 5/50 one to two p.o. every 4-6 hours p.r.n. pain.   ALLERGIES:  INCLUDE ACE INHIBITORS WHICH CAUSE ANGIOEDEMA.   PHYSICAL EXAM:  Temperature 98.9, blood pressure 150/100, pulse 93,  respirations 20,  satting 94-97% on room air.  GENERAL:  He is a very pleasant African American gentleman who is  accompanied by friends in the emergency room.  He is uncomfortable due  to pain in his bilateral upper extremities with slow reluctant movements  and otherwise no acute distress .  HEAD:  Normocephalic, atraumatic.  Eyes are PERRL.  EOMI.  Mild scleral  injection but no icterus.  ENT:  Dry but oropharynx otherwise clear.  Mucous membranes without  bleeding, no exudate.  Hearing is grossly normal.  LUNGS:  Are clear to auscultation bilaterally.  No wheeze or crackle.  No increased work of breathing at rest or with conversation.  CARDIOVASCULAR:  Regular rate and rhythm without murmurs or rubs.  EXTREMITIES:  Show anterior tibial stasis ulcer but no edema  bilaterally.  SKIN:  See above regarding anterior stasis ulcer, 5 x 4 cm.  No  decubitus noted.  ABDOMEN:  Soft, nondistended, nontender to palpation.  Positive bowel  sounds.  No mass, hepatosplenomegaly appreciated.  NEURO:  Cranial nerves II-XII are symmetrically intact.  He follows  commands.  He speaks without dysarthria or aphasia.  MUSCULOSKELETAL:  Shows left olecranon bursitis without erythema or  acute inflammation.  Bilateral MCPs right greater than left are swollen  and hot and the left knee is with moderate acute effusion which is warm.  All joints are tender to touch and with passive range of motion due to  acute inflammation.  No evidence of tophi and bilateral feet, ankles are  unaffected.  Gait is not tested due to acute pain.  PSYCH:  He is awake, alert, oriented x3 and without anxiety, depression.  His mood and affect are normal and pleasant.   LABORATORY DATA:  Reviewed.  Sodium 146, potassium 3.5, chloride 113,  bicarb of 21, BUN of 91, creatinine of 4.13, glucose of 135, AST of 162,  ALT of 92, total bilirubin of 2.9, alk phos normal at 92.  CBC with  elevated white count at 20.8, 89% neutrophils, hemoglobin normal at  14.5  and platelets normal at 256.  Total CK of 5485 with elevated MB fraction  but normal index.  Troponin minimally elevated and insignificant at this  time at 0.11.  EKG:  Mild ST abnormalities  V1 and V2 without prior  comparison, question old septal infarct but no acute ischemic change.  Normal sinus at 90 beats per minute and normal QTC.  Chest x-ray  reviewed and negative for acute disease.   ASSESSMENT/PLAN:  1. Acute renal failure.  This is likely due to rhabdomyolysis in the      setting of fall as well as dehydration and possible toxic      medication effect with diuretic and angiotensin II receptor      blocker.  He is hemodynamically stable with good perfusion, good      heart rate and urine output appears adequate in the emergency room      at this time.  We will treat aggressively with IV hydration and      monitor followup creatinine values as well as monitor urine output      going forward at this time, placing Foley as needed.  No evidence      for post renal obstruction or prerenal causes but we will hold      renal toxic medications, avoid contrast and consider renal      ultrasound if creatinine unimproving with this treatment.  Hold      angiotensin-II receptor blocker and diuretics as well as      allopurinol at this time.  2. Rhabdomyolysis with elevated CK.  Likely due to prolonged time on      floor with immobilization from fall greater than 48 hours ago.      Normal index with relatively normal troponin and nonacute      electrocardiogram.  Not on statin or other toxic medication.  We      will treat aggressively with IV fluid, monitor CK trend as well as      renal function as above.  3. Acute polyarthropathy presumed severe gout.  History of same.  Low      likelihood of septic arthritis.  He has no other obvious evidence      for infection at this time but given marked leukocytosis with mild      left shift, we will check blood cultures and urine culture at  this      time and treat with empiric Cipro pending these results.  Chest x-      ray is unremarkable and he has no other clinically localizing      symptoms of gastrointestinal or pulmonary infection.  Neuro is      clear and suspect musculoskeletal inflammation with gout is his      problem.  Subsequently treat with IV steroids p.r.n. colchicine,      holding allopurinol due to renal failure and consider Orthopedics      consult as needed.  4. Hypertension.  Uncontrolled due to no medications in last 48 hours.      We will continue home Norvasc and beta-blocker holding angiotensin      II receptor blocker and hydrochlorothiazide until renal function is      improved.  5. Dehydration associated with mild hypernatremia and      hyperchloridemia.  Treat with half normal saline at per problem #1      above and recheck in the morning.  6. Mild increased liver function studies due to rhabdomyolysis and      muscle destruction.  No evidence for gastrointestinal abnormality.      We will recheck in the morning.  7. Please see orders for further details.      Vikki Ports  Edsel Petrin, MD  Electronically Signed     VAL/MEDQ  D:  01/01/2009  T:  01/01/2009  Job:  [id number]

## 2010-09-23 NOTE — Assessment & Plan Note (Signed)
OFFICE VISIT   Andre Thompson, REEVER  DOB:  [birth year]                                       07/27/2007  CHART#:[id number]   Patient is a 65 year old male referred by Dr. Cato Mulligan for evaluation of a  right ankle ulcer.  The ulcer has been present for three years.  He has  had multiple treatments with Unna boots and Apligraf in the past.  He  was able to slowly heal the ulcer.  He then developed problems with his  left ankle, requiring a split-thickness skin graft.  This was also for  ulcerations.  The right leg ulcer has never completely healed and has  had exacerbations and remissions.  He has no prior history of DVT.  He  has no significant family history.  He currently is treating the ulcers  with light compression.   His past medical history is otherwise remarkable for hypertension.   PAST SURGICAL HISTORY:  Split-thickness skin graft to the left leg.   MEDICATIONS:  1. Micardis/HCT 80/12.5 once daily.  2. Lotrel 10/40 once daily.  3. Labetalol 200 mg once daily.  4. Colchicine 0.6 mg once daily.  5. Aspirin 81 mg once daily.   He has no known drug allergies.   FAMILY HISTORY:  Unremarkable.   SOCIAL HISTORY:  He is married.  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  He is a nonsmoker.  He drinks approximately two beers  per day.   REVIEW OF SYSTEMS:  He is 6 foot 3, 235 pounds.  He denies cardiac,  pulmonary, GI, GU, neurologic, psychiatric, or hematologic review of  systems.  He has some occasional rashes on his leg from a skin  standpoint.  Vascular review of systems is otherwise unremarkable other  than his recurrent ulcerations.   PHYSICAL EXAMINATION:  Blood pressure is 164/99 in the left arm.  Heart  rate is 62 and regular.  HEENT:  Unremarkable.  He has 2+ carotid pulses  without bruits.  Chest:  Clear to auscultation.  Cardiac:  Regular rate  and rhythm.  Abdomen is soft and nontender with no masses.  He has 2+  femoral, 2+  dorsalis pedis, and 2+ posterior tibial pulses bilaterally.  He has scars on the left leg, consistent with a split-thickness skin  graft near the gaiter area.  He has a right lateral malleolus ulcer  which is approximately 1 cm in diameter.  The skin has a woody,  thickened appearance in both lower extremities.  There are no obvious  varicosities on the skin.   I believe the best option for the patient currently is compression to  try to heal the ulcers conservatively.  I have prescribed new  compression stockings for him today to make sure that he is getting  adequate compressions.  He also had a venous duplex exam today which  showed no DVT and bilateral competent greater saphenous veins.  However,  he did have an incompetent perforator at the ankle level just above his  ulcer.  In light of this, he may be a candidate for sclerotherapy of the  perforating vein, and we will have him reviewed by Dr. Arbie Cookey, who is  currently doing this procedure at his next office visit in one month.   Janetta Hora. Fields, MD  Electronically Signed  CEF/MEDQ  D:  07/27/2007  T:  07/28/2007  Job:  [id number]   cc:   Valetta Mole. Swords, MD

## 2010-09-23 NOTE — Op Note (Signed)
NAMELEVERETT, CAMPLIN                ACCOUNT NO.:  [id number]   MEDICAL RECORD NO.:  [id number]          PATIENT TYPE:  AMB   LOCATION:  SDS                          FACILITY:  [facility]   PHYSICIAN:  Nadara Mustard, MD     DATE OF BIRTH:  [birth year]   DATE OF PROCEDURE:  02/17/2008  DATE OF DISCHARGE:                               OPERATIVE REPORT   PREOPERATIVE DIAGNOSIS:  Chronic deep wound venous stasis,  insufficiency, ulceration, right leg.   POSTOPERATIVE DIAGNOSIS:  Chronic deep wound venous stasis,  insufficiency, ulceration, right leg.   PROCEDURES:  1. Irrigation and debridement of skin and soft tissue, debridement of      necrotic tissue within the wound beds from 3 wounds.  2. Application of Apligraf.  3. Chronic wounds, right leg, application of a Unna boot compressive      wrap.   ANESTHESIA:  None.   DISPOSITION:  To home in stable condition.   INDICATIONS FOR PROCEDURE:  The patient is a 65 year old gentleman with  chronic venous stasis, insufficiency, ulceration to the right leg.  He  has undergone a prolonged conservative care with compressive wraps.  The  ulcers have healed significantly with good granulation tissue; however,  at this time, they have stalled in their healing, and he presents at  this time for application of Apligraf.  Risks and benefits were  discussed including persistent not healing, infection, need for  additional treatment.  The patient states that he understands and wishes  to proceed at this time.   DESCRIPTION OF PROCEDURE:  The patient was brought to short stay.  His  dressing was removed.  The skin and soft tissue was debrided off  necrotic tissue by completing viable granulation tissue within the 3  wound beds.  The Apligraf was fenestrated, applied to the 3 wounds.  This was covered with the Mepitel, Bactroban and a Unna boot compressive  wrap was applied.  The patient was discharged to home in stable  condition.  He is to follow up  in the office on Tuesday.      Nadara Mustard, MD  Electronically Signed     MVD/MEDQ  D:  02/17/2008  T:  02/17/2008  Job:  [id number]

## 2010-09-23 NOTE — Assessment & Plan Note (Signed)
OFFICE VISIT   DAKSH, COATES  DOB:  1945-05-24                                       08/31/2007  CHART#:[id number]   The patient presents today for continued followup of his severe venous  hypertension bilaterally.  He underwent evaluation by Dr. Darrick Penna on  July 27, 2007.  I am seeing in further discussion of this.  The patient  has somewhat unusual situation.  He is an otherwise healthy 65 year old  gentleman with severe bilateral venous hypertension with bilateral  venous ulceration.  These are now nearly healed bilaterally.  He had  undergone a formal duplex evaluation and I review this with him.  This  shows no evidence of reflux in his saphenous his vein or deep system  bilaterally.  He does have perforator vein reflux above his ankles above  the area of ulceration bilaterally.  I explained that I would not  usually suspect this level of severe venous hypertension simply based on  reflux alone, although this can happen.  I explained treatment option  for his perforator reflux to include surgical repair but more recently  attempt to treat these with sclerotherapy.  I explained the treatment  with sclerotherapy, percutaneous injection of sclerotherapy agent as an  outpatient.  I explained this is a procedure.  I did explain the  potential risk for DVT but feel this is quite low.  Also explained the  possibility of incomplete occlusion of these perforators.  I feel this  is appropriate to proceed with his bilateral treatment  to hopefully  improve his severe venous hypertension.  He had been wearing compression  garments and explained the critical importance of lifelong use of these  regardless of any other treatment.  We will proceed with this once we  have assured insurance coverage.   Larina Earthly, M.D.  Electronically Signed   TFE/MEDQ  D:  08/31/2007  T:  09/01/2007  Job:  [id number]   cc:   Valetta Mole. Swords, MD  Janetta Hora. Darrick Penna, MD

## 2010-09-23 NOTE — Consult Note (Signed)
[name], [name]:  [id number]   MEDICAL RECORD NO.:  [id number]          PATIENT TYPE:  INP   LOCATION:  1438                         FACILITY:  [facility]   PHYSICIAN:  Alben Deeds, MD      DATE OF BIRTH:  07-28-1945   DATE OF CONSULTATION:  DATE OF DISCHARGE:                                 CONSULTATION   CHIEF COMPLAINT:  Diffuse joint pain.   HISTORY OF PRESENT ILLNESS:  [name] is a very nice 65 year old male  with a chronic history of gout who recently presented to the hospital  with an acute episode of gout, rhabdomyolysis, and acute renal  insufficiency.  He states that he returned home from work on Thursday  night and ate several items of food including cheese and beer which  typically worsen his gout.  He then awoke on Friday morning with  symptoms of bilateral elbow pain and hand pain consistent with a gout  flare.  He did not have any colchicine on-hand. His gout flare worsened  throughout the day on Friday to the point where he was unable to get out  of bed.  He then was in bed throughout the weekend and does not remember  much of the events over the next couple of days. When he did not show up  to work the next week, EMS was called and they found him down.  When he  came to the ER he was in acute rhabdomyolysis with acute renal  insufficiency.  During this hospitalization he has been treated with IV  fluids and has had resolution of his rhabdomyolysis as well as well as  his renal insufficiency.  Unfortunately he has continued to have  persistent joint pain involving his elbows, MCPs, knees and ankles.  He  has been treated with Solu-Medrol and his symptoms have now improved  over the last couple of days.  He did have a somewhat extensive workup  including a positive rheumatoid factor and markedly elevated sed rate.  This prompted rheumatologic workup to help evaluate whether or not this  was a gout flare versus other autoimmune causes of  his symptoms.  When  questioned today the patient states that his joint pain has markedly  improved over the last couple of days.  He does still have residual  weakness but has worked with Physical Therapy today.  He had some mild  residual pain in his elbows, MCPs, wrists and knees.   REVIEW OF SYSTEMS:  Negative except for that listed in the history of  present illness.   PAST MEDICAL HISTORY:  1. 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  2. History of CVA.  3. Chronic renal insufficiency.  4. Hypertension.  5. Gout.  6. Hyperlipidemia.  7. Chronic anemia.   ALLERGIES:  ACE inhibitors cause angioedema.   CURRENT MEDICATIONS:  1. Allopurinol 300 mg daily.  2. Norvasc 10 mg daily.  3. Aspirin 81 mg daily.  4. Labetalol 300 mg t.i.d.  5. Lovenox.  6. Claritin 10 mg daily.  7. Solu-Medrol 40 mg q.8h. dosed since 08/29.  SOCIAL HISTORY:  He lives alone in [place].  He denies any smoking or  alcohol use.  He is currently employed.   FAMILY HISTORY:  Was reviewed and noncontributory.   PHYSICAL EXAMINATION:  VITAL SIGNS:  Temperature 98.1, pulse 73,  respirations 18, blood pressure 124/77.  GENERAL:  He is alert and oriented x4 without acute distress.  HEENT:  Head is normocephalic and atraumatic.  Mucous membranes were  moist.  NECK:  Supple without lymphadenopathy.  CARDIOVASCULAR:  Regular rate and rhythm.  S1, S2. No murmurs.  LUNGS:  Clear to auscultation without wheezes, rales or rhonchi.  ABDOMEN: Soft with positive bowel sounds.  EXTREMITIES:  No edema.  SKIN:  No rash.  COMPLETE MUSCULOSKELETAL EXAM:  He has 5/5 strength in his upper  extremities and mildly diminished strength in his lower extremities.  His shoulders have normal range of motion without evidence of effusion.  His elbows are tender to palpation bilaterally with bilateral swelling  of his olecranon bursa.  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  His wrists have mild  tenderness and mild fullness on  palpation.  His MCPs have mild synovitis in the right hand and he does  not have synovitis in his left hand.  His PIP and DIP joints do not have  synovitis.  His knees have bilateral tenderness to palpation.  He does  have a suprapatellar effusion in his left knee.  His ankles are cool  without effusion.   REVIEW OF LABORATORY DATA:  Blood cultures are no growth times 5 days  for two blood cultures, sedimentation rate 80.  CK value has decreased  from 5485 on admission to 128 today.  ANA value negative. Rheumatoid  factor 23.  Sodium 138, potassium 4.3, chloride 104, CO2 25, BUN 50,  creatinine 1.44, glucose 136.  White blood cell count 16.9, hemoglobin  13.1, platelets 283.  Uric acid level 14.6.   REVIEW OF RADIOGRAPHS:  Left knee radiograph shows a suprapatellar  effusion. Right knee radial graft shows a stable calcification without  evidence of fracture. Right hand x-ray shows periarticular osteopenia  and questionable arthropathy.   ASSESSMENT/PLAN:   ASSESSMENT:  [name] is a 65 year old gentleman with chronic gout who  recently presented to the hospital with an acute gout flare,  rhabdomyolysis, and acute renal insufficiency.  His rhabdomyolysis and  renal insufficiency have resolved with fluid hydration and his gout is  now resolving with IV steroids.  It is my impression that this was  exacerbated by a severe gout flare and I do not see any other evidence  of autoimmune disease at this time.  Specifically I do not believe that  he has rheumatoid arthritis.  It is noted that he has been on  allopurinol 300 mg a day but still has a uric acid level of 14.6.   PLAN:  1. Regarding his gout treatment, I would recommend tapering his      steroids slowly over the next couple of weeks.  I would first      recommend changing to prednisone 40 mg a day by mouth.  He should      continue this dose for at least 1 week before decreasing the dose      by  about 10 mg every week.  I will ultimately see him in my      outpatient clinic about 1 week after discharge and take care of      further  prednisone tapering.  2. I would stop his allopurinol at this point.  It is obviously not      working to lower his uric acid level since it was recorded at 14.6      upon admission.  It was also stopped for a couple of days and      restarted as best as I can tell and this could lead to acute      worsening of his gout in the short term.  Ultimately, I would      recommend that he start Uloric as an outpatient.  This is a      relatively new uric acid lowering therapy which is specifically      targeted at patients with renal insufficiency who have been      intolerant to allopurinol.  He is a very good candidate for this      medication and I would like to start it as an outpatient.      Ultimately I would not recommend doing this until his current gout      flare is under control as it can worsen gout flares during its      initiation.  I would also hold colchicine at this time because of      his recent renal insufficiency.  3. I would like him to follow up with me 1 week after discharge at      [facility] [address], [address]. Our      phone number is [phone]. Please arrange for an appointment      with him 1 week after discharge for hospital followup.  I have also      given him my card with that information to ensure that an      appointment is made.   Thank you again for your consultation and please do not hesitate to  contact me with any further questions or concerns.      Alben Deeds, MD  Electronically Signed     JB/MEDQ  D:  01/07/2009  T:  01/07/2009  Job:  [id number]

## 2010-09-23 NOTE — Procedures (Signed)
DUPLEX DEEP VENOUS EXAM - LOWER EXTREMITY   INDICATION:  Bilateral lower extremity edema.   HISTORY:  Edema:  Yes.  Trauma/Surgery:  No.  Pain:  No.  PE:  No.  Previous DVT:  None.  Anticoagulants:  None.  Other:   DUPLEX EXAM:                CFV   SFV   PopV  PTV    GSV                R  L  R  L  R  L  R   L  R  L  Thrombosis    o  o  o  o  o  o  o   o  o  o  Spontaneous   +  +  +  +  +  +  +   +  +  +  Phasic        +  +  +  +  +  +  +   +  +  +  Augmentation  +  +  +  +  +  +  +   +  +  +  Compressible  +  +  +  +  +  +  +   +  +  +  Competent     +  +  +  +  +  +  +   +  +  +   Legend:  + - yes  o - no  p - partial  D - decreased   IMPRESSION:  1. No evidence of deep venous thrombosis noted in bilateral legs.  2. Perforator with reflux noted at the bilateral ankle level above the      ulcers.    _____________________________  Larina Earthly, M.D.  MG/MEDQ  D:  07/27/2007  T:  07/27/2007  Job:  [id number]

## 2010-09-26 NOTE — Assessment & Plan Note (Signed)
[facility]   NAME:  AXTYN, WOEHLER                ACCOUNT NO.:  [id number]   MEDICAL RECORD NO.:  [id number]      DATE OF BIRTH:  [birth year]   PHYSICIAN:  Theresia Majors. Tanda Rockers, M.D. VISIT DATE:  09/25/2005                                     OFFICE VISIT   SUBJECTIVE:  Mr. Nakanishi returns for a followup of the left stasis ulcerations.  In the interim, he has worn compressive wrap with a Silver seal dressing. In  the interim, he has developed a low grade temperature and has generally felt  weakness. He has had chills and fevers. He has not seen his primary care  physician.   OBJECTIVE:  VITAL SIGNS:  Blood pressure is 152/98, pulse rate 84,  respiratory rate 16 and he is 100 degrees orally.  EXTREMITIES:  Inspection of the wounds show that they have all cleared. They  are much cleaner than on previous examinations. There is healthy granulation  tissue in all wounds. The drainage is considerably less. There is no  erythema or cellulitis. There is no tenderness in the popliteal fossa.  Wounds 2, 3, and 4 have a similar appearance. Neither required debridement.   ASSESSMENT:  1.  Improvement of the wound.  2.  The patient may be developing a septic response to some other pathology.   PLAN:  We will continue his wound care with a compressive wrap and silver  dressing. We have recommended that the patient be seen by his primary care  physician to evaluate his complaints of weakness and fever. I do not feel  that this is related to his wound. We have advised the patient that the  dressing can be removed if his primary care physician feels that that is  necessary. We will see the patient in 3 days to re-evaluate his wound.           ______________________________  Theresia Majors Tanda Rockers, M.D.     [name]  D:  09/25/2005  T:  09/26/2005  Job:  [id number]

## 2010-09-26 NOTE — H&P (Signed)
NAME:  Andre Thompson, DEFELICE NO.:  [id number]   MEDICAL RECORD NO.:  [id number]          PATIENT TYPE:  REC   LOCATION:  FOOT                         FACILITY:  [facility]   PHYSICIAN:  Bruce Rexene Edison. Swords, M.D. [facility] OF BIRTH:  1945/11/16   DATE OF ADMISSION:  08/05/2005  DATE OF DISCHARGE:                                HISTORY & PHYSICAL   HISTORY OF PRESENT ILLNESS:  [name] is a 65 year old male who has been  treated for a long time (years) for nonhealing ulcers of his lower  extremity.  He comes in today referred back from the wound clinic for  progressive worsening of his left lower extremity ulcers.  He has two large  ulcers, one medial malleolus, the other over the anterior tibial area.  He  denies any pain to the area.  He denies any discharge of significance from  the areas.  He states the areas have grown.  I have spoken with Dr. Tanda Rockers  and he states that the areas look worse and have grown.  Apparently,  cultures have been done at the wound clinic with organisms grown and he has  tried antibiotics as an outpatient without improvement.  He has also tried  multiple other local care procedures without improvement of the lower  extremity ulcers.  Over the months/years, [name] has had recurrent and new  ulcers which come and go with therapy.  The patient knows of no other  modifying factors, no associated signs or symptoms.  [name] has denied  fevers or chills.   PAST MEDICAL HISTORY:  Significant for hypertension, gout.  He has had a  stroke, history of hyperlipidemia, and hypertension.   CURRENT MEDICATIONS:  1.  Micardis 80/12.5 one p.o. daily.  2.  Aspirin 81 mg p.o. daily.  3.  Labetalol 200 mg b.i.d.  4.  Vytorin 10/40 one p.o. daily.  5.  Lotrel 10/40 one p.o. daily.   ALLERGIES:  No known drug allergies.   SOCIAL HISTORY:  He is married.  He is a nonsmoker.  He travels frequently  for work.  No international travel.   FAMILY HISTORY:  No known  skin diseases.   REVIEW OF SYSTEMS:  He denies any chest pain, shortness of breath, PND,  orthopnea, abdominal pain, change in appetite, weight loss.  He denies any  other complaints in a complete review of systems.   PHYSICAL EXAMINATION:  VITAL SIGNS:  Weight 234.  Temperature 98.1, pulse  68, respirations 16, blood pressure 118/82.  GENERAL:  Well-developed, well-nourished black male in no acute distress.  HEENT:  Atraumatic, normocephalic.  Extraocular movements intact.  NECK:  Supple without lymphadenopathy, thyromegaly, jugular venous  distention,  or carotid bruits.  CHEST:  Clear to auscultation without increased work of breathing.  CARDIAC:  S1 and S2 are normal without murmurs or gallops.  ABDOMEN:  Active bowel sounds.  Soft, nontender.  There is no  hepatosplenomegaly.  No masses are palpated.  NEUROLOGIC:  He is alert and oriented without any motor or sensory deficits.  EXTREMITIES:  Left lower extremity: There is  2+ edema to the mid calf.  There is a large anterior tibial ulcer which seems to extend down to the  extensor tendon.  Also, below the ulcer is a 1.5-cm thickened area which  does not feel fluctuant but serous fluid can be expressed from it.  Also,  has a large medial malleolar wound which is quite deep.   LABORATORIES:  None.   EKG:  None.   CHEST X-RAY:  None.   ASSESSMENT AND PLAN:  1.  Recurrent lower extremity ulcers.  This is very unusual for venous      insufficiency.  He has not responded to local care as venous      insufficiency would.  He has had biopsy of the area to rule out      infectious disease.  He has seen Dr. Emily Filbert in the past.  Her      documentation suggests that he was referred to [facility],      although he did not make that appointment.  I think the patient needs      hospitalization for inpatient evaluation.  Will have ID and orthopedics      see the patient.  Will rule out osteomyelitis with x-ray and MRI       scanning.  2.  [name] other medical problems, including hypertension,      hyperlipidemia, and history of stroke are currently well controlled.      Will continue his home medications.      Bruce Rexene Edison Swords, M.D. [facility]  Electronically Signed     BHS/MEDQ  D:  10/06/2005  T:  10/06/2005  Job:  [id number]

## 2010-09-26 NOTE — Op Note (Signed)
NAME:  Andre Thompson, Andre Thompson NO.:  [id number]   MEDICAL RECORD NO.:  [id number]          PATIENT TYPE:  INP   LOCATION:  NA                           FACILITY:  [facility]   PHYSICIAN:  Nadara Mustard, MD     DATE OF BIRTH:  [birth year]   DATE OF PROCEDURE:  11/19/2005  DATE OF DISCHARGE:                                 OPERATIVE REPORT   PREOPERATIVE DIAGNOSIS:  Chronic venous stasis ulcer, left lower extremity.   POSTOPERATIVE DIAGNOSIS:  Chronic venous stasis ulcer, left lower extremity.   PROCEDURE:  1.  Irrigation and debridement of skin and soft tissue and muscle, left leg.  2.  Split-thickness skin graft to the left leg.   SURGEON:  Dr. Lajoyce Corners.   ANESTHESIA:  General.   ESTIMATED BLOOD LOSS:  Minimal.   ANTIBIOTICS:  1 gram of Kefzol.   DRAINS:  None.   COMPLICATIONS:  None.   Cultures obtained x2.   DISPOSITION:  To PACU in stable condition.   INDICATIONS FOR PROCEDURE:  The patient is a 65 year old gentleman with  chronic massive venous stasis insufficiency ulcer, left lower extremity.  The patient has failed prolonged conservative care.  His care has even  included a wound vac treatment which has failed and presents at this time  for surgical intervention.  Risks and benefits were discussed including  infection, neurovascular injury, nonhealing of the wound, and need for  additional surgery.  The patient states he understands and wished to proceed  at this time.   DESCRIPTION OF PROCEDURE:  The patient was brought to OR room 10 and  underwent general anesthetic.  After adequate level of anesthesia was  obtained, the patient's left lower extremity was prepped using Betadine  paint and draped into a sterile field.  The patient had massive hypertrophic  granulation tissue over the anterior tibial wound.  This was debrided of  skin and soft tissue and necrotic tendon.  The wound measures 8 x 6 inches.  After debridement to a good bleeding viable  granulation base, the wound was  irrigated with pulse lavage, and cultures were obtained.  This was sterilely  draped, gloves were changed and attention was focused on the proximal split-  thickness skin graft.  The skin graft was taken 0.0125 inches.  This was  meshed 1 to 1.5.  Mepitel, Bactroban and a sterile dressing was applied to  the thigh.  The split-thickness skin graft was applied to the leg, and this  was secured  with approximating staples.  Wound was covered with Mepitel, Bactroban,  4x4s, ABD and a compressive Coban dressing was applied similar to a Dynaflex  wrap.  The patient was extubated and taken to PACU in stable condition.  Plan for IV antibiotics, 23-hour observation, discharged to home on his  continued course of doxycycline.      Nadara Mustard, MD  Electronically Signed     MVD/MEDQ  D:  11/19/2005  T:  11/19/2005  Job:  [id number]

## 2010-09-26 NOTE — Assessment & Plan Note (Signed)
[facility]   NAME:  Andre Thompson, Andre Thompson                ACCOUNT NO.:  [id number]   MEDICAL RECORD NO.:  [id number]      DATE OF BIRTH:  1946/01/03   PHYSICIAN:  Theresia Majors. Tanda Rockers, M.D. VISIT DATE:  09/18/2005                                     OFFICE VISIT   Andre Thompson has been followed for left stasis ulcerations which have grown  Pseudomonas previously.  He was last treated with Prisma and an Radio broadcast assistant.  During the interim, he reports that he has had minimum pain but has had  persistent swelling and moderate drainage.   OBJECTIVE:  His vital signs:  Blood pressure is 146/102, pulse rate of 56,  respirations 16, and he is afebrile.  The measurements of the left medial  wound demonstrate a 50% increase in volume.  Similarly, the left anterior  wound has increased.   ASSESSMENT:  Retrogression.   PLAN:  We will institute a Prisma dressing change every-other day with  Profore wrap to control venous hypertension.  We have also recultured the  wounds today following full-thickness debridement of both wounds.  We will  see the patient in 3 days.           ______________________________  Theresia Majors Tanda Rockers, M.D.     Andre Thompson  D:  09/18/2005  T:  09/19/2005  Job:  [id number]

## 2010-09-26 NOTE — Discharge Summary (Signed)
NAME:  Andre Thompson, Andre Thompson NO.:  [id number]   MEDICAL RECORD NO.:  [id number]                   PATIENT TYPE:  INP   LOCATION:  0357                                 [facility]ACILITY[facility]  [facility]   PHYSICIAN:  [name], M.D. [facility]             DATE OF BIRTH:  [birth year]   DATE OF ADMISSION:  09/01/2002  DATE OF DISCHARGE:                                 DISCHARGE SUMMARY   DISCHARGE DIAGNOSIS:  1. Acute cerebrovascular accident.  2. History of multiple asymptomatic prior cerebrovascular accidents by     MRI/MRA.  3. Hypertension.   PROCEDURES:  1. Had MRIs/MRA, which showed an acute infarct in the right parietal cortex     with 3-4 small clustered acute infarcts of the cortex in that area.  No     hemorrhage identified.  No other acute infarcts identified.  There were     chronic ischemic changes noted otherwise, including chronic infarct in     the right external capsule, mild chronic infarction in the     periventricular white matter bilaterally.  MRA revealed no evidence of     significant intracranial stenosis or occlusion.  There was some     tortuosity of the distal vertebral arteries with some mass effect in the     medulla on the right side, likely secondary to chronic hypertension.   CONSULTATIONS:  None.   HISTORY AND PHYSICAL:  See that dictated in admission, September 01, 2002.   HOSPITAL COURSE:  Mr. Meinders is a 65 year old black male who is here for the  above.  He was admitted with evidence of acute infarct on MRI, as has been  stated, back three to four days, and actually had started to improve over  that period of time.  He remained stable through his hospitalization.  In  fact, had improved somewhat more but was still left with some distal left  upper extremity numbness and mild distal left upper extremity weakness but  near normal grip strength.  There were no new neurological deficits noted.   His Accupril was increased from 20 to 40 mg on  admission, which seemed to  improve his blood pressure control.  Initially 170 systolic.  Now, upon  discharge, about 120 systolic.   Cardiac enzymes and telemetry were negative.  We had intended to order a 2-D  echocardiogram with carotid Dopplers, but these were unable to be obtained  over the weekend.  As he remained stable, to improve neurologically, through  his hospitalization, then MRI showed evidence, primarily of small-vessel  disease, and with his long history of hypertension, felt that he had gained  maximum benefit from his hospitalization.  Also, it is noted that he was not  on aspirin prior to admission, and this was started as well.  There were no  other problems identified through his hospitalization.  IV heparin was  deemed not likely  to be helpful, and stroke team evaluations unnecessary.  It was a relatively mild deficit at this point.   DISPOSITION:  Discharged to home in good condition.   ACTIVITY/DIETARY:  There are no activity or dietary restrictions except that  as before of low-sodium diet.   DISCHARGE MEDICATIONS:  1. Ecotrin 325 mg p.o. q.d.  2. Prinivil 40 mg p.o. q.d.  3. Micardis 80 mg p.o. q.d.  4. Norvasc 5 mg p.o. q.d.  5. Catapres-TTS 0.1 mg every 3 days.   FOLLOW UP:  He is to follow up with Dr. Cato Mulligan on April 28th as planned.  Some consideration should be made for obtaining extracranial arterial  Doppler on that evaluation to rule out carotid stenosis and 2-D  echocardiogram, but again given the situation as above, it is felt that he  is at relatively low risk for embolic phenomenon, and this can be done as an  outpatient.                                               [name], M.D. [facility]    JWJ/MEDQ  D:  09/03/2002  T:  09/03/2002  Job:  [id number]

## 2010-09-26 NOTE — Discharge Summary (Signed)
[name], [name] NO.:  [id number]   MEDICAL RECORD NO.:  [id number]          PATIENT TYPE:  INP   LOCATION:  6703                         FACILITY:  [facility]   PHYSICIAN:  Rene Paci, M.D. LHCDATE OF BIRTH:  Jul 15, 1945   DATE OF ADMISSION:  10/06/2005  DATE OF DISCHARGE:  10/08/2005                                 DISCHARGE SUMMARY   DISCHARGE DIAGNOSES:  1.  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  2.  Anemia with positive fecal occult blood.  3.  Weight-loss, unintentional.  4.  renal insufficiency.   HISTORY OF PRESENT ILLNESS:  [name] is a 65 year old African-  American male admitted on Oct 06, 2005 with a long-term history of recurrent  lower extremity ulcers which had not been responsive to specialized wound  care as an outpatient at the wound clinic.  The patient was admitted for  further evaluation and to rule out any infectious etiology involving the  wound.   PAST MEDICAL HISTORY:  1.  Hypertension.  2  Gout.  1.  History of CVA.  2.  History of hyperlipidemia.  3.  History of hypertension.   COURSE OF HOSPITALIZATION:  PROBLEM #1 -. CHRONIC NONHEALING PAINLESS  RECURRENT LEFT LOWER EXTREMITY ULCERS WAS UNCLEAR ETIOLOGY:  The patient was  evaluated during this admission by Dr. Cliffton Asters of Infectious Disease.  He was not clear what was causing Mr. Dawn's unusual chronic ulcers.  He did  not feel that there was any infectious cellulitis or fasciitis and he  favored continued observation off of antibiotics.  He did recommend  continuing evaluation of his recent unintentional weight loss, new anemia  and renal insufficiency.  The patient did undergo plain films of the left  ankle, which showed no signs of osteomyelitis, and a left tib-fib x-ray,  which showed no osteomyelitis.  MRI of the left lower extremity showed no  osteomyelitis and no abscess.   A wound care nurse  evaluation was also requested during this admission and  the patient was seen by Pilar Grammes.  She recommended that the patient have  hydrogel applied daily.  The patient was also evaluated by Dr. August Saucer of  Orthopedics, who recommended hydrogel daily, pending wound V.A.C. placement  and arrangements have been made for a wound V.A.C. to be applied at home  with home health to follow.  At the time of this dictation, we are currently  awaiting input from Plastic Surgery, who has been consulted to see the  patient this evening.  The patient will be evaluated by Dr. Sherald Hess of  Plastics.   PROBLEM #2 - ANEMIA:  Iron studies as well as B12 and folate were ordered  during this admission, which were normal.  However, the patient was noted to  have a positive fecal occult blood.  He reports a normal colonoscopy  approximately 1-1/2 years ago.  We will defer further workup to the  patient's primary care.   PROBLEM #3 - RENAL INSUFFICIENCY:  The patient's creatinine on admission was  2.0.  We do not have any recent creatinines for comparison.  Consider  further evaluation as outpatient.   MEDICATIONS AT DISCHARGE:  1.  Labetalol 200 mg p.o. b.i.d.  2.  Aspirin 81 mg p.o. daily.  3.  Lotrel 10/40 one tablet p.o. daily.  4.  Micardis 80.12.5 one tablet p.o. daily.  5.  Vytorin 10/40 mg p.o. daily.   PERTINENT LABORATORY DATA AT DISCHARGE:  Fecal occult blood positive.  BUN  19, creatinine 2.0.  Hemoglobin 10.2, hematocrit 29.5.   DISPOSITION:  Plan to transfer the patient to home.  Home health has been  requested to applied wound V.A.C. at 125 mmHg continuous, to be changed  every Monday, Wednesday and Friday with home health and hydrogel to be  applied to the wound until the V.A.C. arrives and to change every other day  with Mepitel to be placed over open tendon.   FOLLOWUP:  The patient is instructed to follow up with Dr. August Saucer next Friday,  in approximately 1 week and call for an  appointment.  He is also instructed  to call Dr. Cato Mulligan for a followup appointment in 1-2 weeks.  He is to call  Dr. Cato Mulligan should he develop drainage from the wound, fever over 101, or  bright red blood in stool.      Melissa S. Peggyann Juba, NP      Rene Paci, M.D. [facility]  Electronically Signed    MSO/MEDQ  D:  10/08/2005  T:  10/09/2005  Job:  [id number]   cc:   G. Dorene Grebe, M.D.  Fax: [phone]   Valetta Mole. Swords, M.D. [facility]  [address] [place]  [place] [address]

## 2010-09-26 NOTE — H&P (Signed)
NAME:  Andre Thompson, [name]:  [id number]   MEDICAL RECORD NO.:  [id number]                   PATIENT TYPE:  INP   LOCATION:  0357                                 FACILITY:  [facility]   PHYSICIAN:  [name], M.D. [facility]             DATE OF BIRTH:  21-May-1945   DATE OF ADMISSION:  09/01/2002  DATE OF DISCHARGE:                                HISTORY & PHYSICAL   CHIEF COMPLAINT:  Left upper extremity weakness and numbness for 4 days.   HISTORY OF PRESENT ILLNESS:  The patient is a 65 year old black male who  suffered the above symptoms with the majority of the symptoms occurring over  2 hours but markedly has been improving actually over the past 3-4 days.  He  was seen by Dr. [name] earlier today and apparently had an elevated  blood pressure and some suspicion for a questionable stroke and was started  on Catapres and Ecotrin.  I was called to the [facility] Radiology this  evening due to evidence of acute CVA right parietal area on the head MRI.  He states overall he has been doing relatively well, there are no  palpitations, chest pain, light-headedness or other new neurologic symptoms  over the past several days, in fact he has improved as above.   PAST MEDICAL HISTORY:  1. Illnesses: Hypertension.  2. Surgeries: None.   ALLERGIES:  None.   MEDICATIONS:  1. Accupril questionable dose - possibly 20 mg daily.  2. Micardis 80 mg once daily.  3. Norvasc 5 mg p.o. once daily.  4. Ecotrin 325 mg p.o. once daily - first dose today.  5. Catapres questionable dose - new today.   SOCIAL HISTORY:  No tobacco.  Alcohol occasional.  Divorced.  No children.   FAMILY HISTORY:  Hypertension.   REVIEW OF SYSTEMS:  Otherwise noncontributory.   PHYSICAL EXAMINATION:  VITAL SIGNS:  Blood pressure 185/111, heart rate 75,  respirations 20, O2 saturations 100% O2.  ENT:  Sclerae are clear.  TMs clear.  Pharynx benign.  NECK:  Without lymphadenopathy,  JVD, thyromegaly.  LUNGS:  Chest no rales or wheezes.  CARDIAC:  Regular rate and rhythm; no murmur.  ABDOMEN:  Somewhat distended abdomen, soft, nontender, positive bowel  sounds; no organomegaly; no masses.  EXTREMITIES:  No edema.  NEUROLOGICAL:  Nonfocal except for 4+/5 left upper extremity weakness and  left grip compared to the right.  He is right-handed but even accounting for  that he seems weak in the left upper extremity.    LABORATORIES:  1. MRI head - the report only - right parietal CVA acute as above.  2. ECG - sinus rhythm, questionable old septal infarct, nonspecific ST-T     wave changes.   ASSESSMENT AND PLAN:  Acute cerebrovascular accident probably a major part  of the abnormality occurring 4 days ago relatively stable and partially  improving symptomatologically since then.  He is to be admitted.  I doubt  intravenous heparin or stroke team helpful at this point.  We will need to  continue the aspirin and attempt better blood pressure control though we  want to be overall a bit less than aggressive than usual given the setting  of acute cerebrovascular accident.  We will apply telemetry, check cardiac  enzymes as well as 2-D echocardiogram given his abnormal ECG, we will check  carotid Dopplers, consider neurologic consult.                                               [name], M.D. [facility]    JWJ/MEDQ  D:  09/01/2002  T:  09/02/2002  Job:  [id number]   cc:   Andre Thompson, M.D. [facility]

## 2010-09-26 NOTE — Consult Note (Signed)
NAME:  Andre Thompson, Andre Thompson NO.:  [id number]   MEDICAL RECORD NO.:  [id number]          PATIENT TYPE:  INP   LOCATION:  6703                         FACILITY:  [facility]   PHYSICIAN:  Burnard Bunting, M.D.    DATE OF BIRTH:  [birth year]   DATE OF CONSULTATION:  10/08/2005  DATE OF DISCHARGE:                                   CONSULTATION   CHIEF COMPLAINT:  Venous stasis ulcers bilateral lower extremities, venous  stasis ulcer bilateral lower extremities.   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 He has had right venous  stasis ulcers treated successfully over a six month time at the [facility] with Roland Rack boot and Profore dressing. He reports a left  medial malleolar ulcer since February 2007. He also subsequently developed  two months after that anterior ulcer with exposed anterior tibial tendon.  The same method was then applied without success including elevation,  Profore dressing, and Unna boot dressing. The patient has had an MRI scan  since he has been in the hospital which was negative for osteomyelitis. The  patient denies any history of trauma. The patient's reason for consultation  has also been obtained.  He denies any fever, chills, but he does have a  little bit of a recent weight loss.   PAST MEDICAL HISTORY:  1.  A 10-25 pound weight loss over the past six months.  2.  Progressive normocytic anemia.  3.  Renal insufficiency.  4.  Right parietal CVA.  5.  Probable peripheral vascular disease.  6.  Dyslipidemia.  7.  Hypertension.  8.  Gout.   CURRENT MEDICATIONS:  Labetalol, aspirin, Avapro, Norvasc, Lotensin,  Vytorin, hydrochlorothiazide, multivitamin, colchicine, Septra,  acetaminophen, Restoril, hydrocodone.   ALLERGIES:  No known drug allergies.   REVIEW OF SYSTEMS:  Review of systems reviewed and are negative.   PHYSICAL EXAMINATION:  VITAL SIGNS:  Temperature 98, blood pressure 120/80,  pulse 63, respirations 20, O2 saturation is 98%. He has bilateral palpable  pedal pulses and in the right lower extremity he has a healed medium-size  venous stasis ulcer. He has mild pitting edema in the right lower extremity.  He has good dorsal flexion, plantar flexion, and strength bilaterally. He  has no proximal lymphadenopathy, with full range of motion in the neck,  hips, and knees. Left lower extremity demonstrates a 2.5 x 3-cm ulceration  full thickness on the medial aspect of the medial malleolus with exposed  granulation tissue, but no exposed bone. There is red granulation tissue at  the base of this ulcer. He has a larger ulcer about 4 x 3 cm over the  anterior tib tendon. On the left foot he does have positive pedal pulses.  Distal to the tendon is a cutaneous nodule and what appears a gouty tophus.  He has been __________ on the dorsal plantar aspect of his foot.   His laboratory values include a sed rate of 85, neutrophil count of 81%,  white count 6.9, hemoglobin 10.2,  hematocrit 29.5, glucose 103, creatinine  2.0. LFTs normal. MRI scan shows no ostia, but no some questionable  fasciitis.   IMPRESSION:  Left lower extremity ulceration in the setting of weight loss,  anemia, and increased creatinine.   PLAN:  The patient has had very appropriate conservative care. Infectious  disease wants to observe him off antibiotics and over the past two to three  days he has not had any fevers and does not appear to have progressive  infection. This area at the inferior aspect of the ulceration on the left  foot anteriorly appears to be a cutaneous manifestation of gout. More  problematic is the exposed tendons and cutaneous ulceration. I would favor  wound vac over a period of three to four weeks to see if granulation tissue  can be pulled over, followed by Apligraf application.           ______________________________  G. Dorene Grebe,  M.D.     GSD/MEDQ  D:  10/08/2005  T:  10/08/2005  Job:  [id number]

## 2010-09-26 NOTE — Assessment & Plan Note (Signed)
[facility]   NAME:  Andre Thompson, Andre Thompson                ACCOUNT NO.:  [id number]   MEDICAL RECORD NO.:  [id number]      DATE OF BIRTH:  [birth year]   PHYSICIAN:  Theresia Majors. Tanda Rockers, M.D. VISIT DATE:  10/02/2005                                     OFFICE VISIT   Andre Thompson is a 65 year old male who had been treated for recurrent stasis  ulcers with complications of infection. He has had multiple culture reports  and has been started on culture on antibiotics which have proven to be  effective in the bacteria growth. In spite of this his wound has been  associated with increase in edema, drainage, but no malodor. The patient has  reported several days of not feeling well and has refused to see his primary  care physician, Dr. Cato Thompson. He presents today with no particular complaints  of pain or fever.   OBJECTIVE:  His blood pressure is 114/80, pulse rate 68 and regular,  respirations 16, temperature 98.4. The inspection of the left lower  extremity, wounds #2, #3, and #4, show overall deterioration of the wound  status. There is marked edema, there is an abscess forming at the anterior  surface of the ankle above the mortise joint. The tibialis anterior tendon  is exposed, it is edematous, and ischemic. Medially, the wound shows some  clean granulation, but there is no evidence of re-epithelialization. The  posterior wound is similarly clean with no active drainage. Review of his  cultures have shown that he has been sensitive to ampicillin and he was  recently started on Augmentin 500 mg b.i.d. and he was also concurrently  being administered Septra-DS two tablets b.i.d. In spite of these efforts  and the local wound care he has experienced deterioration.   PLAN:  We have discussed these clinical findings and this assessment with  Dr. Cato Thompson who has agreed to make a referral for Andre Thompson to an orthopedist  or general surgeon for drainage of the abscess on the anterior aspect  of his  lower extremity. We have also discussed the association of the patient's  general malaise over the last two weeks for which he has been reluctant to  seek medical attention. We have thus instructed Andre Thompson as per Dr. Cato Thompson  to go home and Dr. Cato Thompson' office will notify him as to how to be managed  further. We have explained to the patient that this is a serious ailment  that would need to be attended to urgently and we are willing to see him  back in the wound clinic once adequate drainage has been established of this  infected left lower extremity.   The patient seems to understand and indicates that he will be compliant.           ______________________________  Theresia Majors. Tanda Rockers, M.D.     [name]  D:  10/02/2005  T:  10/02/2005  Job:  [id number]   cc:   Valetta Mole. Swords, M.D. [facility]  [address] [place]  [place] [address]

## 2010-09-26 NOTE — Assessment & Plan Note (Signed)
[facility]   NAME:  KIEL, COCKERELL                ACCOUNT NO.:  [id number]   MEDICAL RECORD NO.:  [id number]           DATE OF BIRTH:   PHYSICIAN:  Theresia Majors. Tanda Rockers, M.D. VISIT DATE:  09/28/2005                                     OFFICE VISIT   VITAL SIGNS:  Vital signs are stable.  He is afebrile.   PURPOSE OF TODAY'S VISIT:  Mr. Bendorf returns for followup of a stasis ulcer  involving the left medial aspect of his lower extremity.  He has been  followed for three different wounds.  During the interim, he has had a  febrile episode which spontaneously abated.  He did not elect to be seen by  his primary care physician.  He reports that he has had no increase in pain,  no malodorous drainage, and no ascending redness.   WOUND EXAM:  Inspection of the lower extremity shows that the edema has  receded, most likely related to his being in the recumbent position over the  weekend.   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   TREATMENT:  Wound #3 is on the anterior surface of the left leg.  There is  near exposure of the anterior tendon.  The wound measures 4 x 2.5 x 0.3.  This wound was full-thickness debrided without difficulty.  Wound #2 on the  left medial aspect measures 3.5 x 2.5 x 0.4.  This wound was full-thickness  debrided without difficulty.  Wound #4 on the posteromedial aspect measured  2 x 1 x 0.3.  This wound was full-thickness debrided.  There remains a 2 to  3+ edema.  The pedal pulses are palpable.   MANAGEMENT PLAN & GOAL:  We have cleansed the wound with Dakin solution and  applied Anasept gel.  We will apply Unna wrap and place the patient on  Augmentin 800 mg p.o. b.i.d. and Sulfa DS two p.o. b.i.d.  We will see the  patient in one week.           ______________________________  Theresia Majors. Tanda Rockers, M.D.     [name]  D:  09/28/2005  T:  09/29/2005  Job:  [id number]

## 2010-09-26 NOTE — Consult Note (Signed)
Andre Thompson, Andre Thompson NO.:  [id number]   MEDICAL RECORD NO.:  [id number]          PATIENT TYPE:  OUT   LOCATION:  XRAY                         FACILITY:  [facility]   PHYSICIAN:  Jonelle Sports. Sevier, M.D. DATE OF BIRTH:  March 11, 1946   DATE OF CONSULTATION:  02/03/2005  DATE OF DISCHARGE:  01/20/2005                                   CONSULTATION   HISTORY:  This 65 year old black male is seen at the courtesy of Dr. Emily Filbert  for assistance with management of a chronic wound of the right lower  extremity.   The patient has had generally reasonably good health except for hypertension  and hyperlipidemia both of which are under treatment allegedly with adequate  control. He has in the past had a cerebrovascular accident which gave him  some initial left upper extremity symptoms but has left him with no  significant residual. Aside from that, he has been remarkably well and  specifically has had no recognized diabetes and denies any previous trouble  with ulcerations of the feet or legs.   With that background history, beginning in early June of this year, the  patient noted the development of a small sore on the inner aspect of the  distal right lower extremity in the supramalleolar area. This did not heal  over a matter of several weeks and so he eventually consulted the physician  who sent him to the dermatologist. They placed some type of dermal patch  over the lesion, told him to leave it in place for several weeks, but with  this the situation worsened with the wound getting significantly larger and  deeper. He was then subsequently seen by Dr. Emily Filbert who placed him on a  course of cephalexin 500 milligrams three times daily which he completed  just on the night prior to this consultation. She also did a number of other  studies which included x-ray which showed no evidence of bony involvement,  culture which showed no staph aureus and no alpha strep, and also some  clotting studies which include PET, factor five Leiden, and several other  things none of which indicated anticoagulation difficulty.   Despite these interventions, there has been no healing of the wound and  accordingly he is referred here now for our evaluation and advice.   Past medical history is essentially as indicated above. He denies any  history of past surgeries. He has had no hospitalizations. He does not  smoke. He drinks approximately two to three beers per day on average.   His family history is notable for both parents dying in their late 48s but  presumably this was secondary to alcoholism. He has eight siblings. Two or  three of them have hypertension but they are otherwise apparently healthy.  The patient is divorced and has no children. 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   EXAMINATION:  Examination today is limited to the distal lower extremities.  Both extremities are mildly edematous, the right slightly more so than the  left. Both show evidence of brawny skin change suggestive of chronic  venous  insufficiency. There are no venous cords and no tenderness over venous  channels. Arterial pulses are bounding and quite adequate bilaterally. There  are no significant lesions on the feet. Monofilament testing shows that he  has intact sensation throughout both feet.   On the right lower extremity in the medial immediate supramalleolar area is  a deep somewhat necrotic ulcer measuring 2.3 x 3.4 cm and approximately 6-  7/10 cm in depth. There is moderate seropurulent drainage and the area is  rather malodorous. There is one small area of clearly necrotic tissue and  some rather autolytic looking fat and fascia. The wound does not appear to  penetrate to tendon or bone at this point.   IMPRESSION:  Stasis ulceration right lower extremity rather advanced.   DISPOSITION:  1.  The wound is sharply debrided as much of the necrotic tissue as possible      and  despite the patient's having fully sensitive status he is able to      tolerate this quite well. There remains, however, considerable      undebrided fibrous exudate and debride of the wound.  2.  Wound was accordingly treated with a generous application of Accuzyme      ointment covered by calcium alginate, a SofSorb pad and then that entire      extremity is to the knee is placed in a Profore wrap.  3.  The patient is advised that he does not need to care for this at home      but to keep it clean and dry. He is also advised that when he is at work      if at all possible he should lift his right lower extremity placed it on      a chair or something so that it is as roughly as high as his hip. He      promises to do this.  4.  Follow-up visit here will be in three days anticipating a rather      striking response to the Accuzyme and a need to cleanse the wound and to      replace it at that time. It is certainly anticipated that particularly      absence of diabetes and with good arterial circulation this wound should      heal but with it being as deep and as chronic and complex as it is this      likely will take a number weeks. He is so advised.  5.  Follow-up visit will be here in three days as indicated.           ______________________________  Jonelle Sports. Cheryll Cockayne, M.D.     RES/MEDQ  D:  02/03/2005  T:  02/04/2005  Job:  [id number]   cc:   Elmon Else, M.D.   Bruce Rexene Edison Swords, M.D. [facility]  [address] [place]  [place] [address]

## 2010-09-26 NOTE — Assessment & Plan Note (Signed)
[facility]   NAME:  SHERMAN, DONALDSON                ACCOUNT NO.:  [id number]   MEDICAL RECORD NO.:  [id number]      DATE OF BIRTH:  03-Apr-1946   PHYSICIAN:  Theresia Majors. Tanda Rockers, M.D. VISIT DATE:  09/21/2005                                     OFFICE VISIT   VITAL SIGNS:   PURPOSE OF TODAY'S VISIT:  Mr. Haji returns for followup of stasis ulcers on  his left lower extremity.  In the interim, he has been wearing a compressive  dressing with Prisma.  Since his discharge, he reports that there is less  drainage.   WOUND EXAM:   WOUND SINCE LAST VISIT:   CHANGE IN INTERVAL MEDICAL HISTORY:   DIAGNOSIS:   TREATMENT:   ANESTHETIC USED:   TISSUE DEBRIDED:   LEVEL:   CHANGE IN MEDS:   COMPRESSION BANDAGE:   OTHER:   MANAGEMENT PLAN & GOAL:   OBJECTIVE:  His vital signs are stable.  Blood pressure 120/96, pulse of 63,  respirations of 16, he is afebrile.  Removal of the wound discloses that  there is new progressive granulation, there is still shaggy exudates, there  is 3+  edema.  The wound has been full thickness debrided, both wounds, #2, 3 and  4, and thereafter the wound was washed with Dakin's solution, irrigated with  saline and an Unna boot compression wrap was placed over Prisma.  Patient  will be seen in 4 days for followup.           ______________________________  Theresia Majors. Tanda Rockers, M.D.     [name]  D:  09/21/2005  T:  09/22/2005  Job:  [id number]

## 2010-10-16 ENCOUNTER — Ambulatory Visit (INDEPENDENT_AMBULATORY_CARE_PROVIDER_SITE_OTHER): Payer: Managed Care, Other (non HMO) | Admitting: Internal Medicine

## 2010-10-16 ENCOUNTER — Encounter: Payer: Self-pay | Admitting: Internal Medicine

## 2010-10-16 VITALS — BP 160/100 | HR 64 | Temp 98.6°F | Ht 75.0 in | Wt 291.0 lb

## 2010-10-16 DIAGNOSIS — I10 Essential (primary) hypertension: Secondary | ICD-10-CM

## 2010-10-16 MED ORDER — HYDROCHLOROTHIAZIDE 25 MG PO TABS: 25.0000 mg | ORAL_TABLET | Freq: Every day | ORAL | Status: DC

## 2010-10-16 NOTE — Assessment & Plan Note (Signed)
Will add hctz-- Discussed possibility of GOUT F/u 6 weeks

## 2010-10-16 NOTE — Progress Notes (Signed)
  Subjective:    Patient ID: Andre Thompson, male    DOB: Jan 08, 1946, 65 y.o.   MRN: [id number]  HPI  Patient comes in for followup. Been trying to improve his blood pressure. I started him on spirometry last office visit. He had side effects to that. Mostly of fatigue. He try to continue the medication he had continued fatigue. He stopped the medication and the fatigue resolved. He went through his trial 2 or 3 different times and each time he tried the spironolactone he was convinced that it contributed to fatigue. He is now off the spiral October and feeling well. He denies any polyuria, polydipsia. Patient has significant venous hypertension with lower extremity edema. He wears compression stockings with good results. He has not had any open venous ulcers in quite some time.  Patient with a history of gout no recurrence of the continued warts. At one time it was felt the chlorthalidone contributed to the gout.  Past Medical History  Diagnosis Date  . Gout   . Hyperlipidemia   . Hypertension   . Stroke   . Rhabdomyolysis    No past surgical history on file.  reports that he has never smoked. He does not have any smokeless tobacco history on file. He reports that he drinks alcohol. He reports that he does not use illicit drugs. family history includes Cancer in his brother and Hypertension in his brother, father, mother, and sister. Allergies  Allergen Reactions  . Ace Inhibitors     REACTION: angioedema--8/09 (on lotrel)  . Chlorthalidone     REACTION: gout     Review of Systems    patient denies chest pain, shortness of breath, orthopnea. Denies lower extremity edema, abdominal pain, change in appetite, change in bowel movements. Patient denies rashes, musculoskeletal complaints. No other specific complaints in a complete review of systems.    Objective:   Physical Exam  well-developed well-nourished male in no acute distress. HEENT exam atraumatic, normocephalic, neck supple  without jugular venous distention. Chest clear to auscultation cardiac exam S1-S2 are regular. Abdominal exam overweight with bowel sounds, soft and nontender. Extremities 1+ edema, wearing compression stockings. Neurologic exam is alert with a normal gait.        Assessment & Plan:

## 2010-11-24 IMAGING — CT CT HEAD W/O CM
1 series · 16 of 30 positions shown, 20 images · non-contrast
Comparison: MR 09/01/2002 by report only

CLINICAL DATA: Pain, gallops

CT HEAD WITHOUT CONTRAST
TECHNIQUE: Contiguous axial images were obtained from the base of
the skull through the vertex without contrast.

[Series 2: head_seq 4.5 h37s st · axial · 0.43mm/px · z∈[-141,+3]mm · 16 of 36 slices shown, 20 images]
[im 2/36  brain]
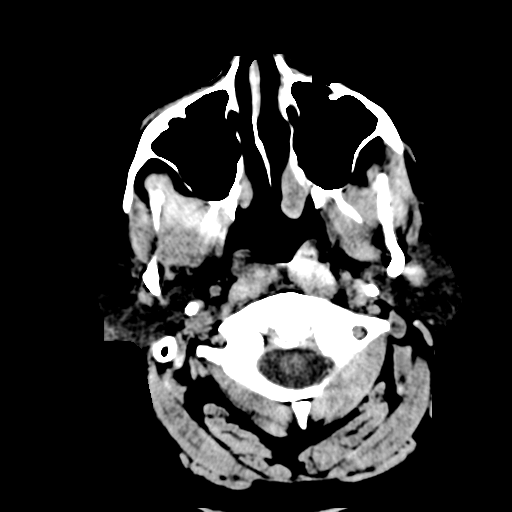
[im 2/36  bone]
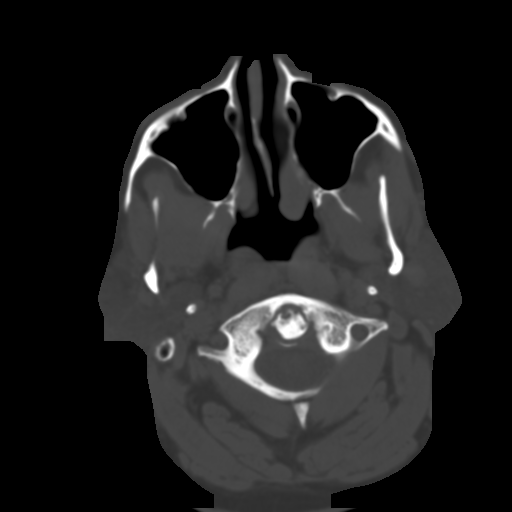
[im 4/36  brain]
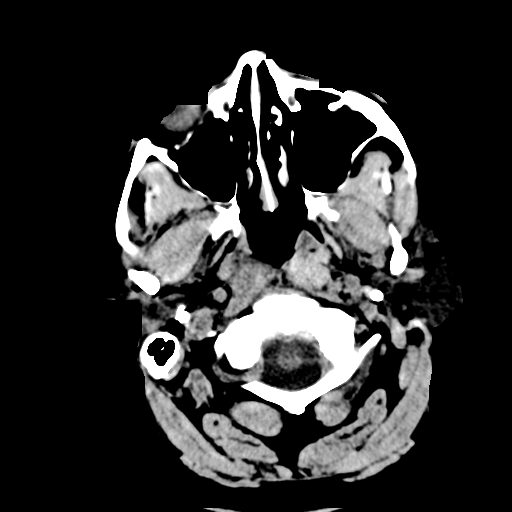
[im 7/36  brain]
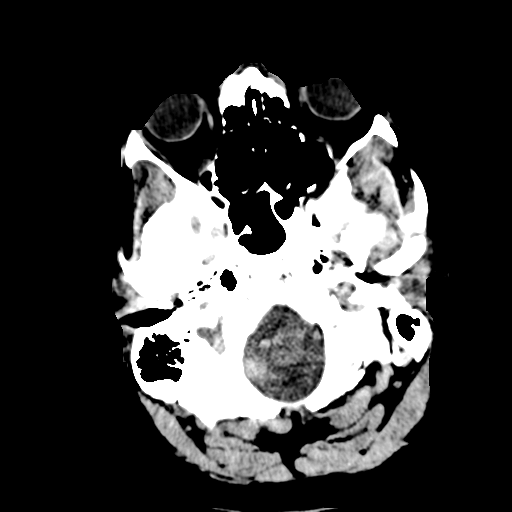
[im 9/36  brain]
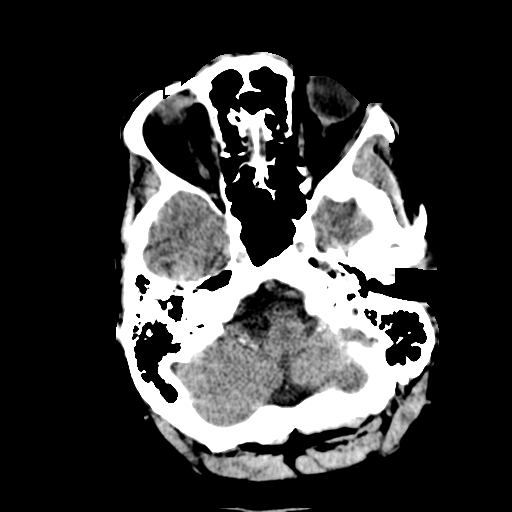
[im 10/36  brain]
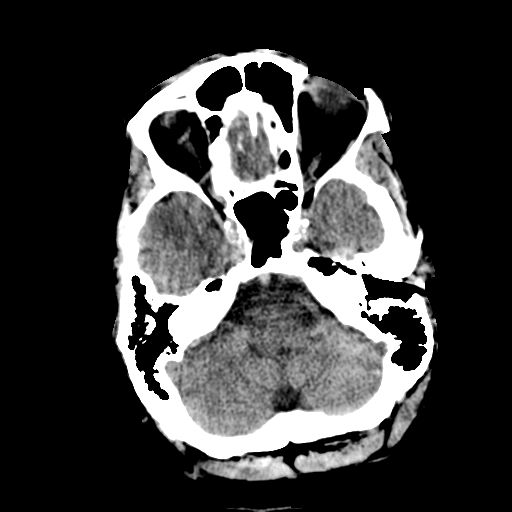
[im 10/36  bone]
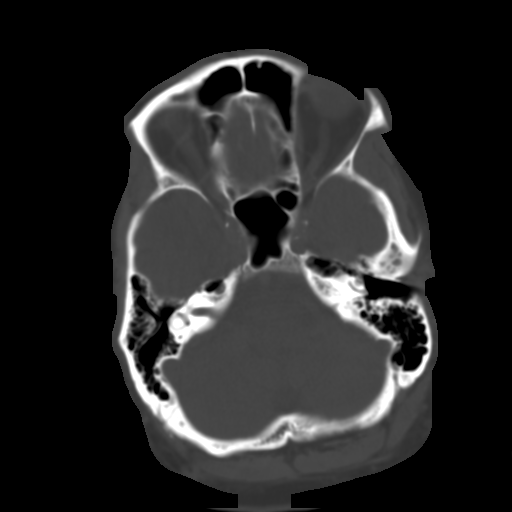
[im 13/36  brain]
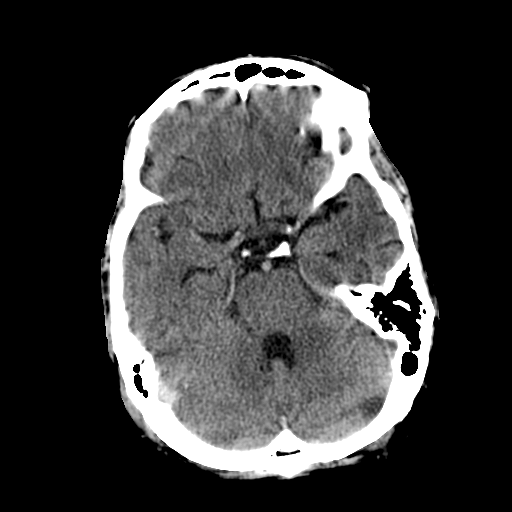
[im 15/36  brain]
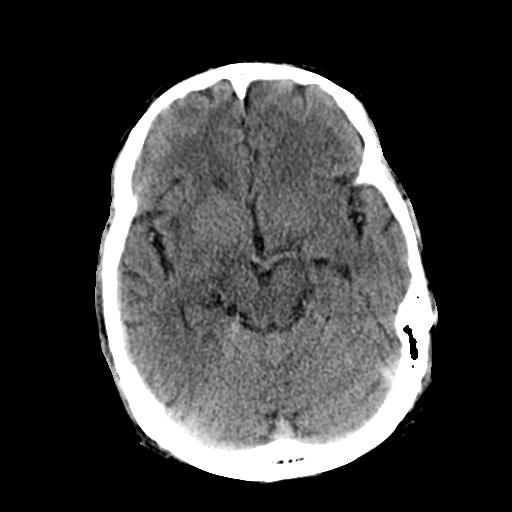
[im 17/36  brain]
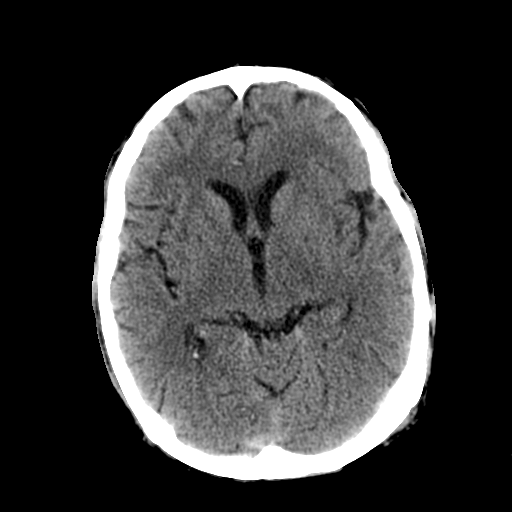
[im 19/36  brain]
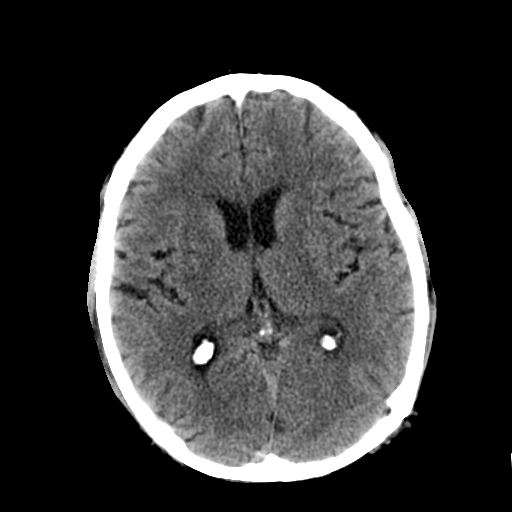
[im 19/36  bone]
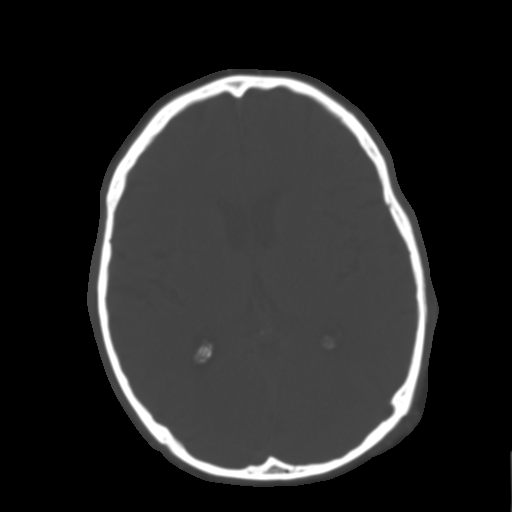
[im 21/36  brain]
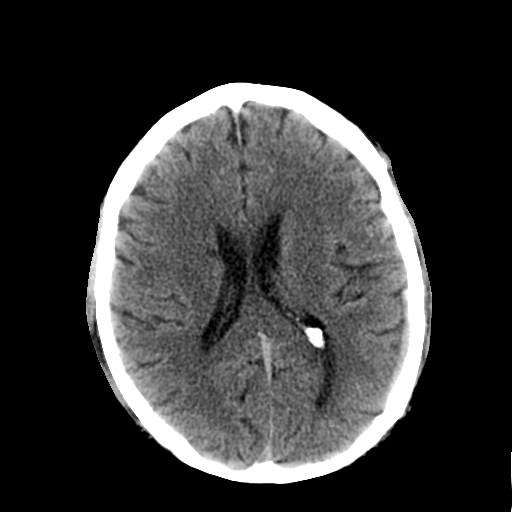
[im 23/36  brain]
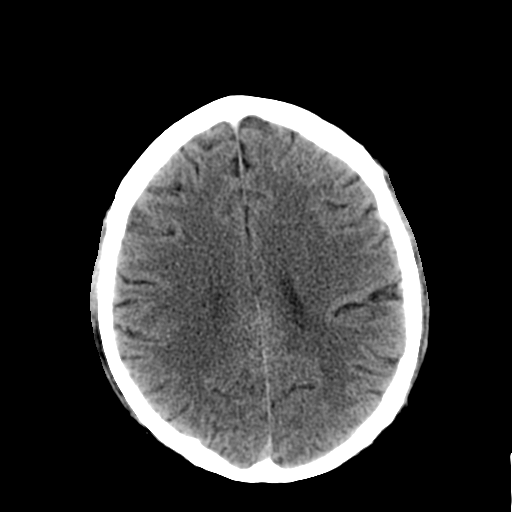
[im 26/36  brain]
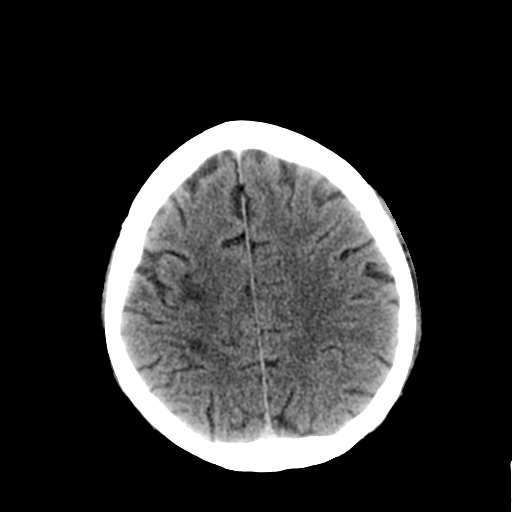
[im 27/36  brain]
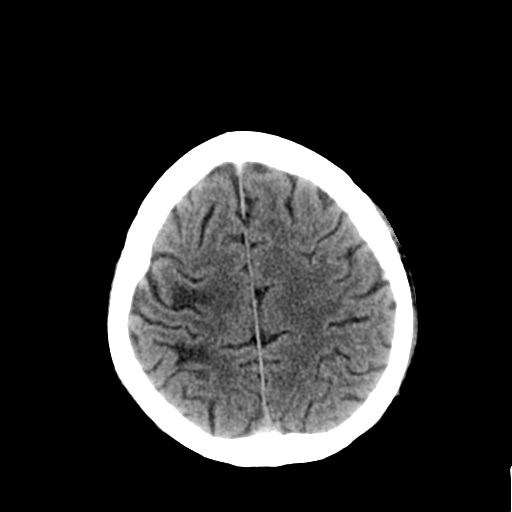
[im 27/36  bone]
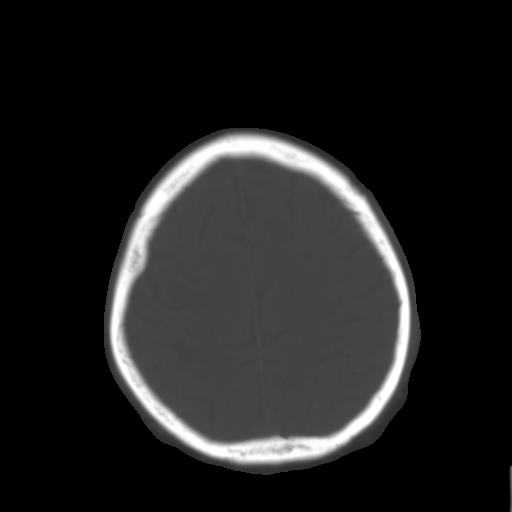
[im 29/36  brain]
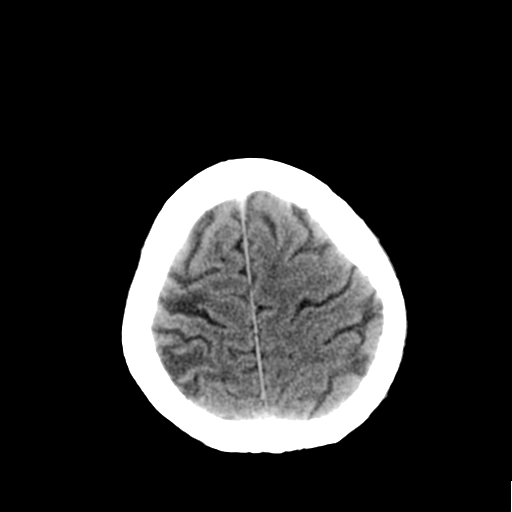
[im 32/36  brain]
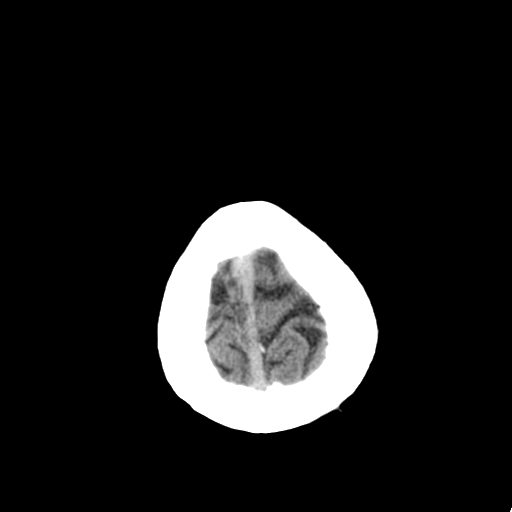
[im 34/36  brain]
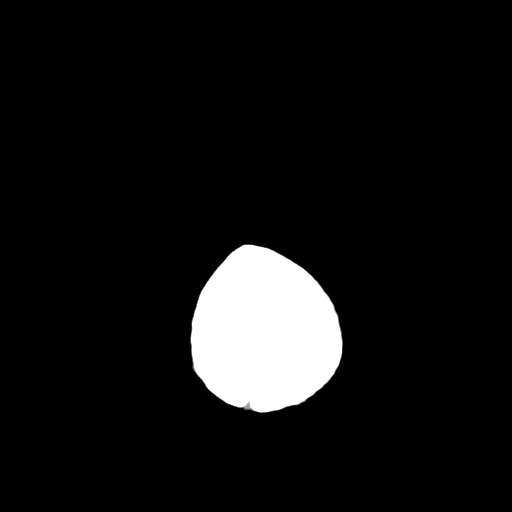

[16 of 30 positions shown; findings below may reference images not displayed]

FINDINGS: Old small right posterior parietal cortical infarcts
which were described on the previous study. Atherosclerotic and
physiologic intracranial calcifications. Diffuse parenchymal
atrophy. Patchy areas of hypoattenuation in deep and
periventricular white matter bilaterally. Negative for acute
intracranial hemorrhage, mass lesion, acute infarction, midline
shift, or mass-effect. Acute infarct may be inapparent on
noncontrast CT. Ventricles and sulci symmetric. Bone windows
demonstrate no focal lesion.
IMPRESSION: 1. Negative for bleed or other acute intracranial process.

2. Atrophy and nonspecific white matter changes

## 2010-11-25 IMAGING — CR DG KNEE 1-2V*R*
2 series · 2 of 2 positions shown · non-contrast
Comparison: 01/20/2005

CLINICAL DATA: Knee pain.

RIGHT KNEE - 1-2 VIEW

[view not recorded (1 of 2)]
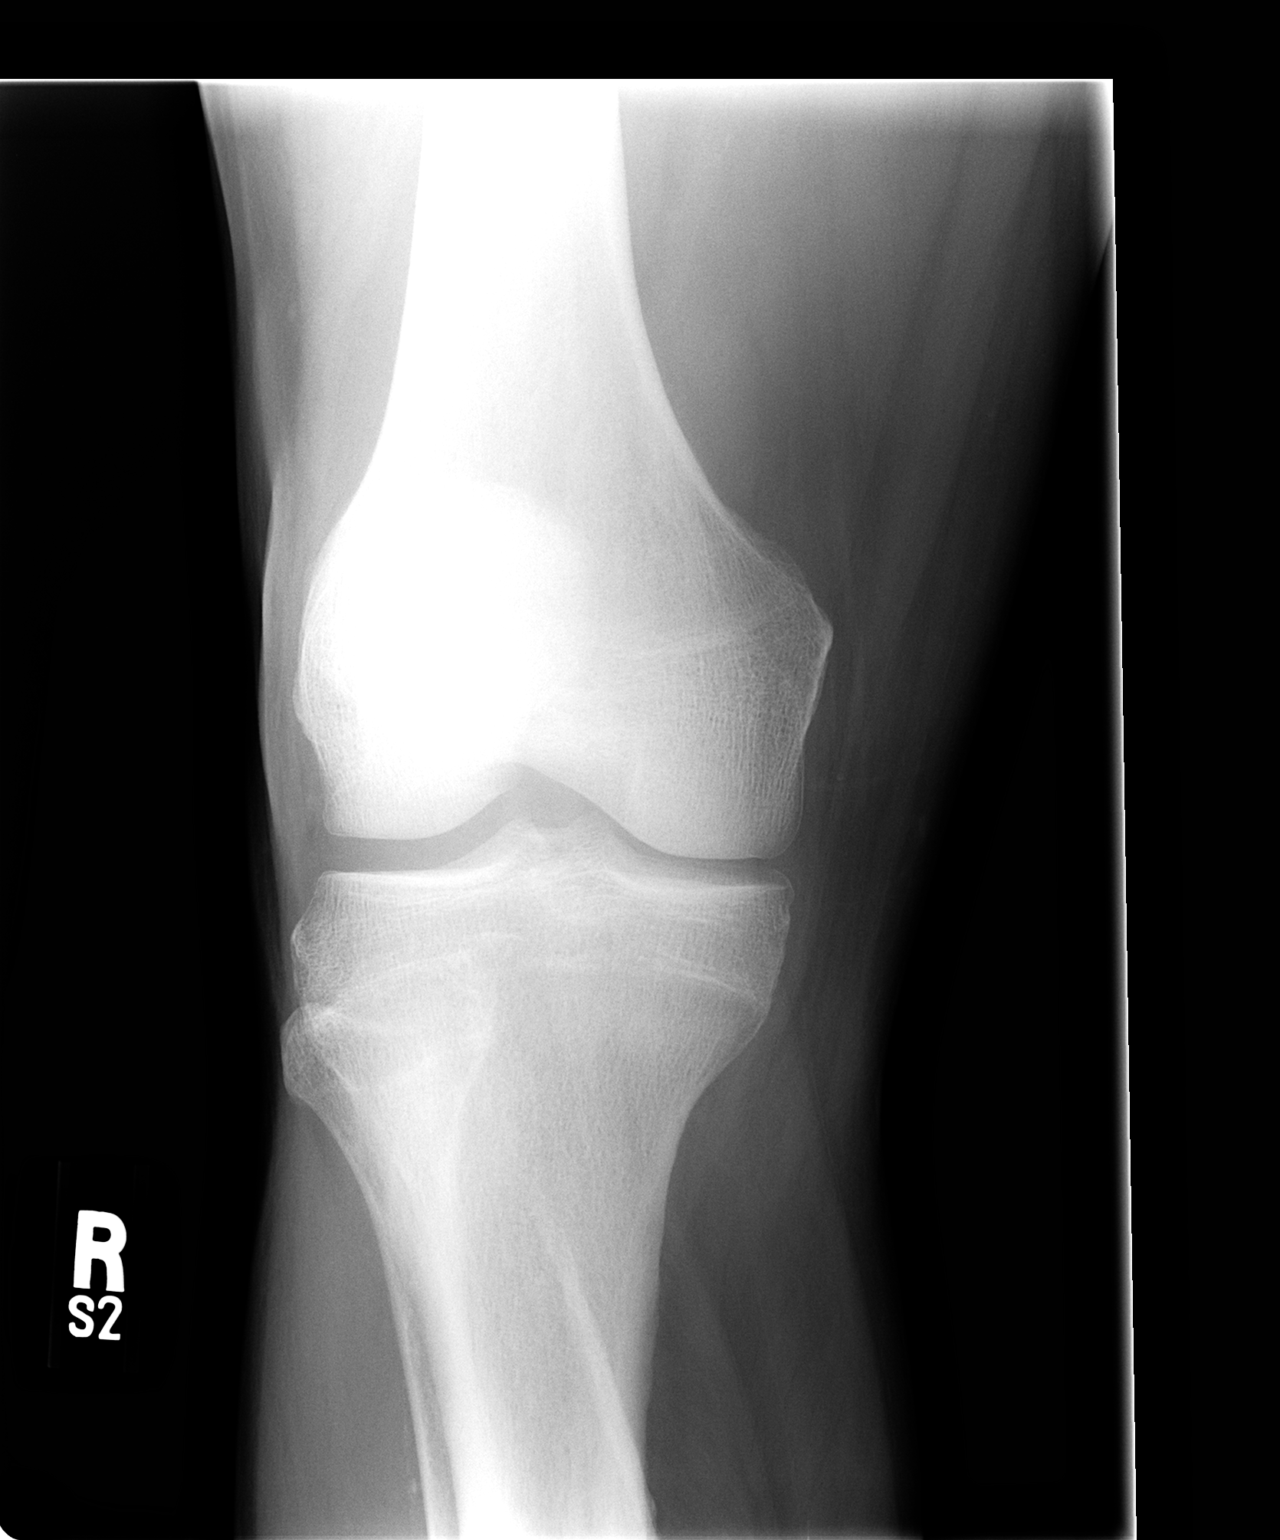

[view not recorded (2 of 2)]
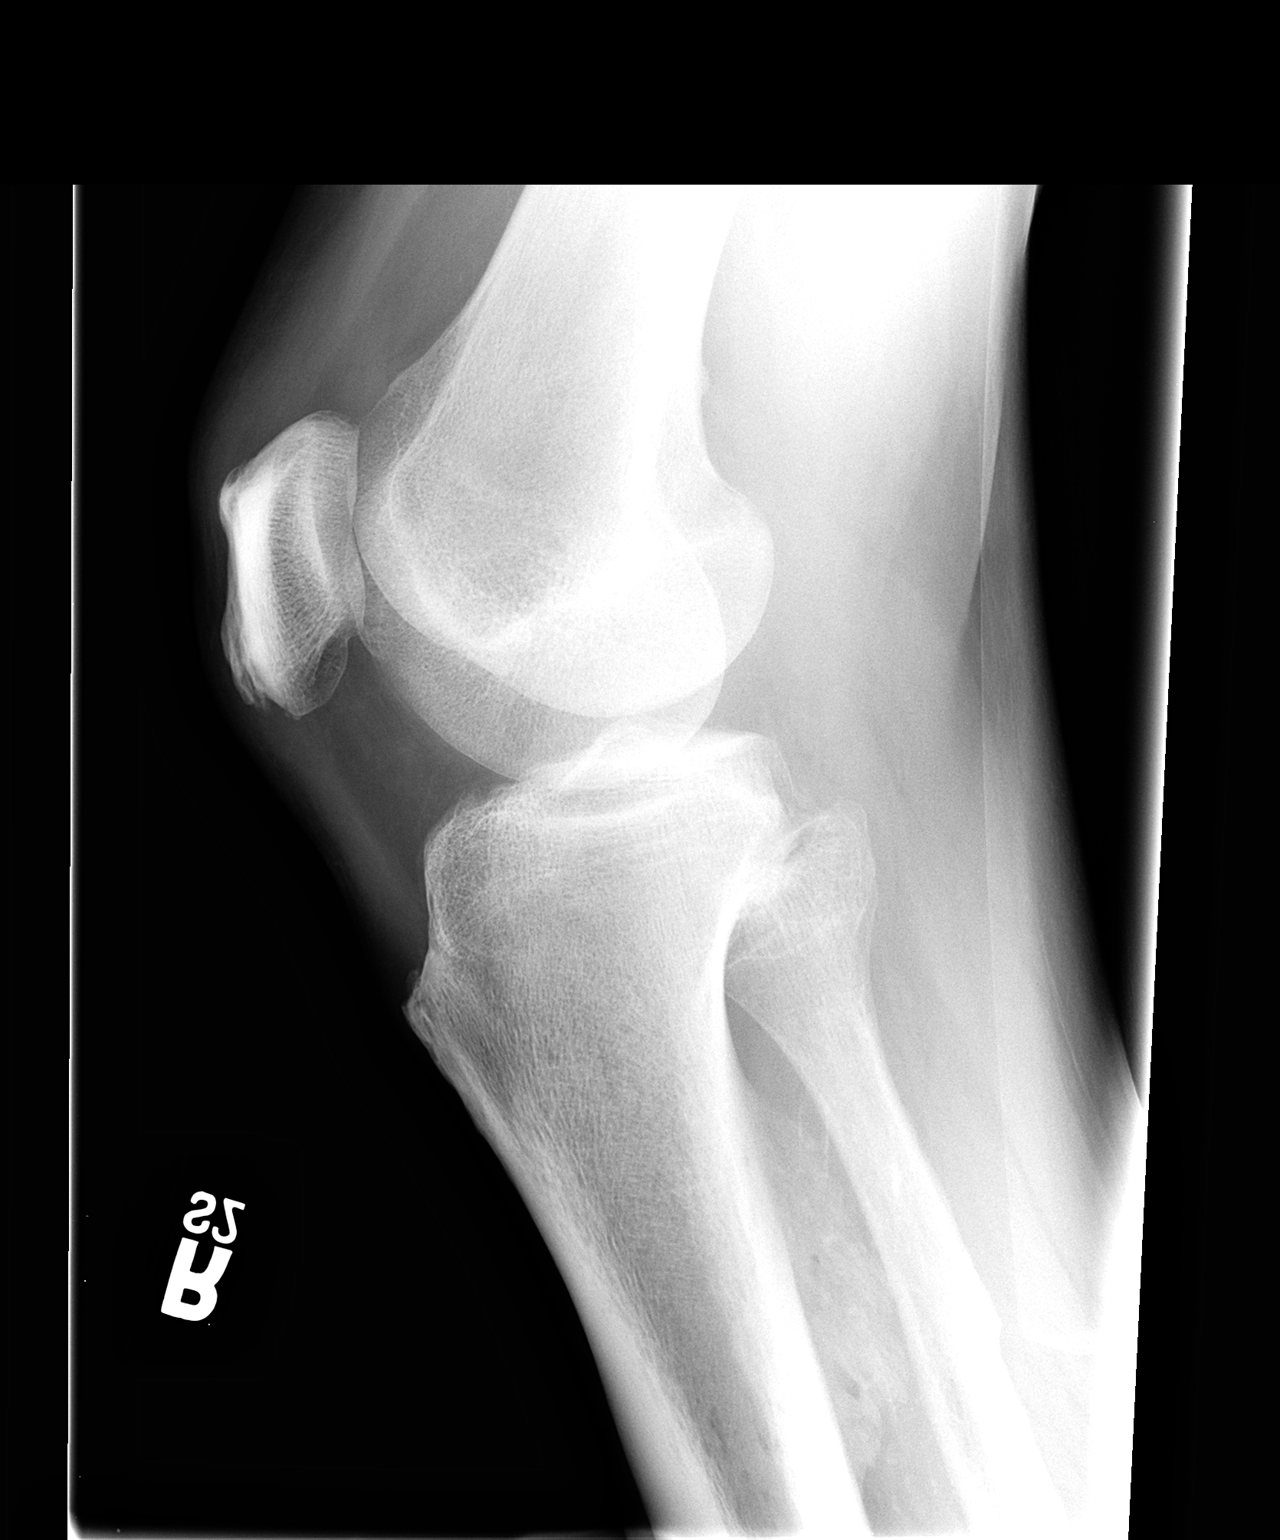

[2 of 2 positions shown; findings below may reference images not displayed]

FINDINGS: The joint spaces are fairly well maintained.  No acute
bony findings or joint effusion.  There is a large band of
calcification projecting off the posterior aspect of the tibia.
This was present on the prior study and is not changed.  A prior
MRI from 10/06/2005 demonstrates this bilaterally and may represent
some type of fascial calcification.
IMPRESSION: 1.  No acute bony findings or significant degenerative changes.
2.  No joint effusion.
3.  Stable area of calcification projecting off the posterior
aspect of the tibia.

## 2010-11-25 IMAGING — CR DG KNEE 1-2V*L*
2 series · 2 of 2 positions shown · non-contrast
Comparison: MRI 10/06/2005

CLINICAL DATA: Knee pain.

LEFT KNEE - 1-2 VIEW

[view not recorded (1 of 2)]
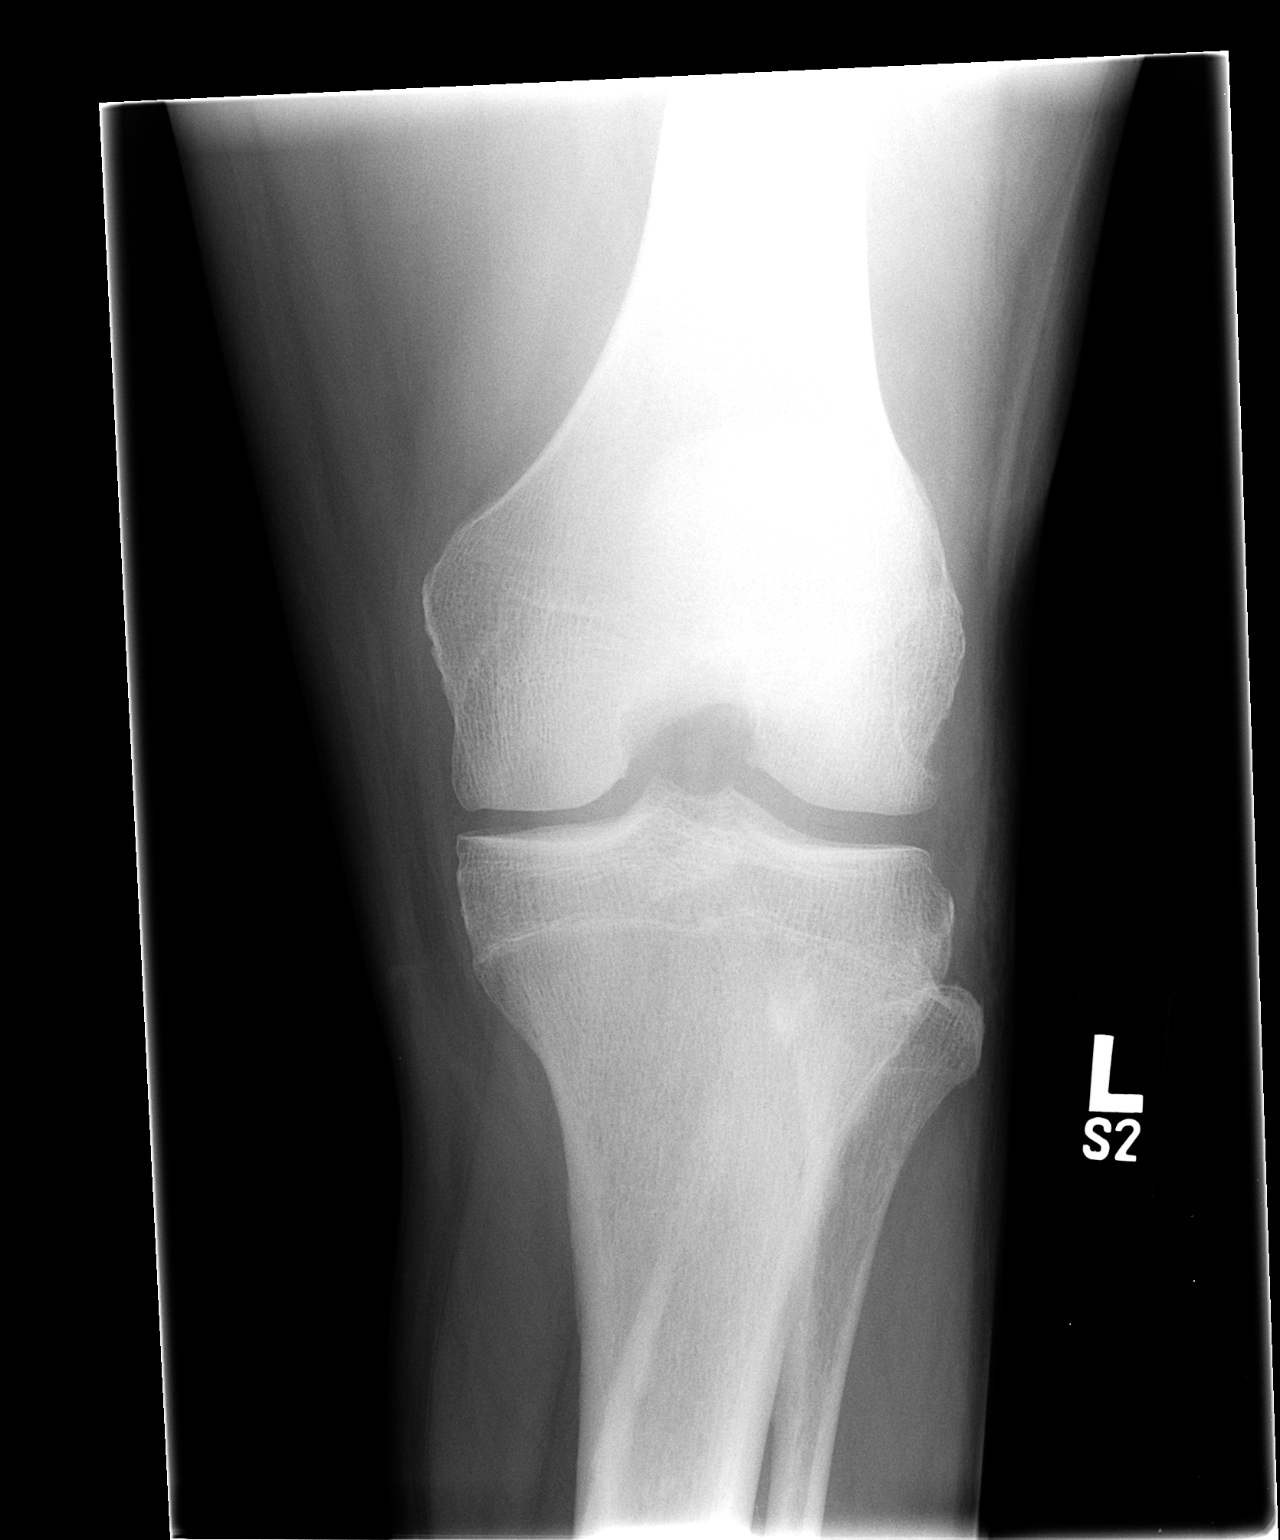

[view not recorded (2 of 2)]
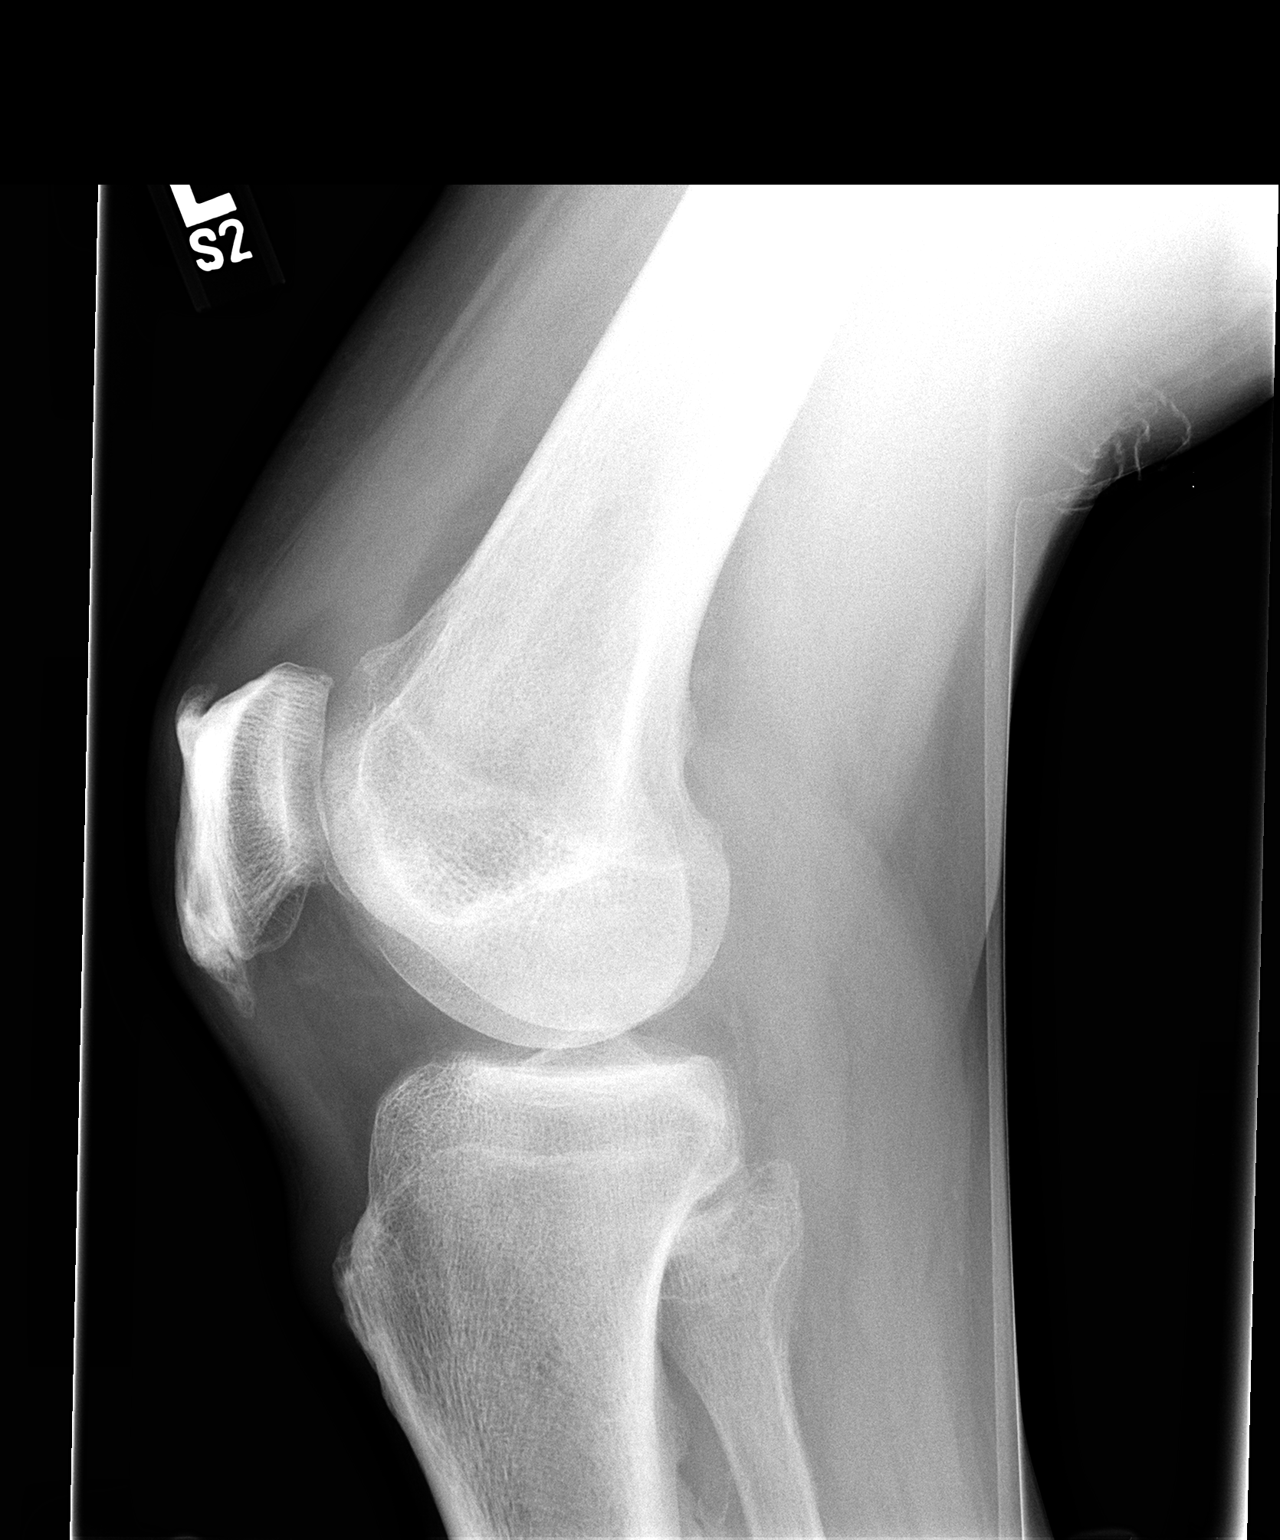

[2 of 2 positions shown; findings below may reference images not displayed]

FINDINGS: The joint spaces are fairly well maintained.  No
significant degenerative changes or acute bony abnormality.  Mild
enthesopathic changes near the quadriceps and patellar tendon
attachments.  There is a small joint effusion.  Posterior tibial
calcifications are again noted.
IMPRESSION: 1.  No acute bony findings or significant degenerative changes.
2.  Suprapatellar knee joint effusion.
3.  Stable posterior tibial calcifications.

## 2010-12-11 ENCOUNTER — Telehealth: Payer: Self-pay | Admitting: *Deleted

## 2010-12-11 NOTE — Telephone Encounter (Signed)
Called patient and left message for him to return our call  to schedule a Colonoscopy.

## 2010-12-16 ENCOUNTER — Ambulatory Visit (INDEPENDENT_AMBULATORY_CARE_PROVIDER_SITE_OTHER): Payer: Managed Care, Other (non HMO) | Admitting: Internal Medicine

## 2010-12-16 ENCOUNTER — Encounter: Payer: Self-pay | Admitting: Internal Medicine

## 2010-12-16 ENCOUNTER — Ambulatory Visit: Payer: Managed Care, Other (non HMO) | Admitting: Internal Medicine

## 2010-12-16 VITALS — BP 148/90 | HR 72 | Temp 98.0°F | Ht 75.0 in | Wt 286.0 lb

## 2010-12-16 DIAGNOSIS — I10 Essential (primary) hypertension: Secondary | ICD-10-CM

## 2010-12-16 MED ORDER — SILDENAFIL CITRATE 100 MG PO TABS: 100.0000 mg | ORAL_TABLET | Freq: Every day | ORAL | Status: DC | PRN

## 2010-12-16 MED ORDER — FEBUXOSTAT 40 MG PO TABS: 40.0000 mg | ORAL_TABLET | Freq: Every day | ORAL | Status: DC

## 2010-12-16 MED ORDER — AMLODIPINE BESYLATE 10 MG PO TABS: 10.0000 mg | ORAL_TABLET | Freq: Every day | ORAL | Status: DC

## 2010-12-16 MED ORDER — TELMISARTAN 80 MG PO TABS: 80.0000 mg | ORAL_TABLET | Freq: Every day | ORAL | Status: DC

## 2010-12-16 MED ORDER — METOPROLOL SUCCINATE ER 200 MG PO TB24: 200.0000 mg | ORAL_TABLET | Freq: Every day | ORAL | Status: DC

## 2010-12-16 MED ORDER — COLCHICINE 0.6 MG PO TABS: 0.6000 mg | ORAL_TABLET | Freq: Two times a day (BID) | ORAL | Status: DC

## 2010-12-16 MED ORDER — HYDROCHLOROTHIAZIDE 25 MG PO TABS: 25.0000 mg | ORAL_TABLET | Freq: Every day | ORAL | Status: DC

## 2010-12-16 NOTE — Progress Notes (Signed)
  Subjective:    Patient ID: Andre Thompson, male    DOB: [birth year], 65 y.o.   MRN: [id number]  HPI  F/u htn No home bps Tolerating meds Started hctz last visit--NO recurrent GOUT (on uloric)  Past Medical History  Diagnosis Date  . Gout   . Hyperlipidemia   . Hypertension   . Stroke   . Rhabdomyolysis    No past surgical history on file.  reports that he has never smoked. He does not have any smokeless tobacco history on file. He reports that he drinks alcohol. He reports that he does not use illicit drugs. family history includes Cancer in his brother and Hypertension in his brother, father, mother, and sister. Allergies  Allergen Reactions  . Ace Inhibitors     REACTION: angioedema--8/09 (on lotrel)  . Chlorthalidone     REACTION: gout     Review of Systems  patient denies chest pain, shortness of breath, orthopnea. Denies lower extremity edema, abdominal pain, change in appetite, change in bowel movements. Patient denies rashes, musculoskeletal complaints. No other specific complaints in a complete review of systems.      Objective:   Physical Exam  well-developed well-nourished male in no acute distress. HEENT exam atraumatic, normocephalic, neck supple without jugular venous distention. Chest clear to auscultation cardiac exam S1-S2 are regular no s4. Abdominal exam overweight with bowel sounds, soft and nontender. Extremities no edema. Neurologic exam is alert with a normal gait.        Assessment & Plan:

## 2010-12-18 NOTE — Telephone Encounter (Signed)
2nd contact attempt. Left a message on home and work Engineer, technical sales. Noted to send the patient a letter.

## 2011-02-17 ENCOUNTER — Encounter: Payer: Self-pay | Admitting: Internal Medicine

## 2011-02-17 ENCOUNTER — Ambulatory Visit (INDEPENDENT_AMBULATORY_CARE_PROVIDER_SITE_OTHER): Payer: Managed Care, Other (non HMO) | Admitting: Internal Medicine

## 2011-02-17 VITALS — BP 142/90 | HR 60 | Temp 98.0°F | Ht 75.0 in | Wt 292.0 lb

## 2011-02-17 DIAGNOSIS — I10 Essential (primary) hypertension: Secondary | ICD-10-CM

## 2011-02-17 DIAGNOSIS — I872 Venous insufficiency (chronic) (peripheral): Secondary | ICD-10-CM

## 2011-02-17 NOTE — Assessment & Plan Note (Signed)
Doing great with stockings---no recurrent open sores Encouraged to do the same

## 2011-02-28 NOTE — Progress Notes (Signed)
  Subjective:    Patient ID: Andre Thompson, male    DOB: [birth year], 65 y.o.   MRN: [id number]  HPI Patient comes in for followup. He has hypertension history of chronic venous insufficiency. History of gout and hyperlipidemia. He is tolerating his medications without difficulty. He's not had any open ulcers on his legs. He denies any fevers or chills, shortness of breath, neurologic deficits, chest pain.  Past Medical History  Diagnosis Date  . Gout   . Hyperlipidemia   . Hypertension   . Stroke   . Rhabdomyolysis    No past surgical history on file.  reports that he has never smoked. He does not have any smokeless tobacco history on file. He reports that he drinks alcohol. He reports that he does not use illicit drugs. family history includes Cancer in his brother and Hypertension in his brother, father, mother, and sister. Allergies  Allergen Reactions  . Ace Inhibitors     REACTION: angioedema--8/09 (on lotrel)  . Chlorthalidone     REACTION: gout      Review of Systems  patient denies chest pain, shortness of breath, orthopnea. Denies lower extremity edema, abdominal pain, change in appetite, change in bowel movements. Patient denies rashes, musculoskeletal complaints. No other specific complaints in a complete review of systems.      Objective:   Physical Exam   well-developed well-nourished male in no acute distress. HEENT exam atraumatic, normocephalic, neck supple without jugular venous distention. Chest clear to auscultation cardiac exam S1-S2 are regular. Abdominal exam overweight with bowel sounds, soft and nontender. Extremities no edema. Neurologic exam is alert with a normal gait.       Assessment & Plan:

## 2011-02-28 NOTE — Assessment & Plan Note (Signed)
BP Readings from Last 3 Encounters:  02/17/11 142/90  12/16/10 148/90  10/16/10 160/100   Fair control. I've asked them to monitor at home. Continue current medications.

## 2011-08-18 ENCOUNTER — Ambulatory Visit (INDEPENDENT_AMBULATORY_CARE_PROVIDER_SITE_OTHER): Payer: BC Managed Care – PPO | Admitting: Internal Medicine

## 2011-08-18 ENCOUNTER — Encounter: Payer: Self-pay | Admitting: Internal Medicine

## 2011-08-18 VITALS — BP 170/100 | HR 60 | Temp 98.4°F | Ht 75.0 in | Wt 297.0 lb

## 2011-08-18 DIAGNOSIS — I872 Venous insufficiency (chronic) (peripheral): Secondary | ICD-10-CM

## 2011-08-18 DIAGNOSIS — M109 Gout, unspecified: Secondary | ICD-10-CM

## 2011-08-18 DIAGNOSIS — I10 Essential (primary) hypertension: Secondary | ICD-10-CM

## 2011-08-18 DIAGNOSIS — E785 Hyperlipidemia, unspecified: Secondary | ICD-10-CM

## 2011-08-18 DIAGNOSIS — I878 Other specified disorders of veins: Secondary | ICD-10-CM

## 2011-08-18 LAB — CBC WITH DIFFERENTIAL/PLATELET
Eosinophils Absolute: 0.3 10*3/uL (ref 0.0–0.7)
Eosinophils Relative: 6.9 % — ABNORMAL HIGH (ref 0.0–5.0)
HCT: 44.5 % (ref 39.0–52.0)
Lymphs Abs: 1.2 10*3/uL (ref 0.7–4.0)
MCHC: 34.4 g/dL (ref 30.0–36.0)
MCV: 94 fl (ref 78.0–100.0)
Monocytes Absolute: 0.5 10*3/uL (ref 0.1–1.0)
Neutrophils Relative %: 59.4 % (ref 43.0–77.0)
Platelets: 214 10*3/uL (ref 150.0–400.0)
RDW: 13.7 % (ref 11.5–14.6)

## 2011-08-18 LAB — LIPID PANEL
Cholesterol: 206 mg/dL — ABNORMAL HIGH (ref 0–200)
HDL: 44.9 mg/dL (ref >39.00)
Triglycerides: 206 mg/dL — ABNORMAL HIGH (ref 0.0–149.0)

## 2011-08-18 LAB — HEPATIC FUNCTION PANEL
Albumin: 4.6 g/dL (ref 3.5–5.2)
Alkaline Phosphatase: 90 U/L (ref 39–117)

## 2011-08-18 LAB — BASIC METABOLIC PANEL
CO2: 25 mEq/L (ref 19–32)
Calcium: 9.1 mg/dL (ref 8.4–10.5)
Creatinine, Ser: 1.6 mg/dL — ABNORMAL HIGH (ref 0.4–1.5)
GFR: 57.95 mL/min — ABNORMAL LOW (ref >60.00)
Sodium: 142 mEq/L (ref 135–145)

## 2011-08-18 LAB — LDL CHOLESTEROL, DIRECT: Direct LDL: 120.1 mg/dL

## 2011-08-18 MED ORDER — HYDRALAZINE HCL 25 MG PO TABS: 25.0000 mg | ORAL_TABLET | Freq: Two times a day (BID) | ORAL | Status: DC

## 2011-08-18 NOTE — Assessment & Plan Note (Signed)
bp too high He needs to lose weight Add hydralazine

## 2011-08-18 NOTE — Progress Notes (Signed)
Patient ID: Andre Thompson, male   DOB: April 26, 1946, 66 y.o.   MRN: [id number] htn---taking meds. Not trying to follow a diet or exercise plan. He is gaining wait.  Gout, no recurrence  Venous stasis--no swelling and no ulcer!  Past Medical History  Diagnosis Date  . Gout   . Hyperlipidemia   . Hypertension   . Stroke   . Rhabdomyolysis     History   Social History  . Marital Status: Married    Spouse Name: N/A    Number of Children: N/A  . Years of Education: N/A   Occupational History  . Not on file.   Social History Main Topics  . Smoking status: Never Smoker   . Smokeless tobacco: Not on file  . Alcohol Use: Yes  . Drug Use: No  . Sexually Active:    Other Topics Concern  . Not on file   Social History Narrative  . No narrative on file    No past surgical history on file.  Family History  Problem Relation Age of Onset  . Hypertension Mother   . Hypertension Father   . Hypertension Sister     4 sisters  . Hypertension Brother     3 brothers  . Cancer Brother     intestine    Allergies  Allergen Reactions  . Ace Inhibitors     REACTION: angioedema--8/09 (on lotrel)  . Chlorthalidone     REACTION: gout    Current Outpatient Prescriptions on File Prior to Visit  Medication Sig Dispense Refill  . amLODipine (NORVASC) 10 MG tablet Take 1 tablet (10 mg total) by mouth daily.  90 tablet  3  . aspirin 81 MG tablet Take 81 mg by mouth daily.        . febuxostat (ULORIC) 40 MG tablet Take 1 tablet (40 mg total) by mouth daily.  90 tablet  3  . hydrochlorothiazide 25 MG tablet Take 1 tablet (25 mg total) by mouth daily.  90 tablet  3  . metoprolol (TOPROL-XL) 200 MG 24 hr tablet Take 1 tablet (200 mg total) by mouth daily.  90 tablet  3  . sildenafil (VIAGRA) 100 MG tablet Take 1 tablet (100 mg total) by mouth daily as needed.  30 tablet  3  . telmisartan (MICARDIS) 80 MG tablet Take 1 tablet (80 mg total) by mouth daily.  90 tablet  3  . DISCONTD:  colchicine 0.6 MG tablet Take 1 tablet (0.6 mg total) by mouth 2 (two) times daily.  180 tablet  3     patient denies chest pain, shortness of breath, orthopnea. Denies lower extremity edema, abdominal pain, change in appetite, change in bowel movements. Patient denies rashes, musculoskeletal complaints. No other specific complaints in a complete review of systems.   BP 170/100  Pulse 60  Temp(Src) 98.4 F (36.9 C) (Oral)  Ht 6\' 3"  (1.905 m)  Wt 297 lb (134.718 kg)  BMI 37.12 kg/m2  well-developed well-nourished male in no acute distress. HEENT exam atraumatic, normocephalic, neck supple without jugular venous distention. Chest clear to auscultation cardiac exam S1-S2 are regular. Abdominal exam overweight with bowel sounds, soft and nontender. Extremities no edema. Neurologic exam is alert with a normal gait.

## 2011-09-17 ENCOUNTER — Ambulatory Visit (INDEPENDENT_AMBULATORY_CARE_PROVIDER_SITE_OTHER): Payer: BC Managed Care – PPO | Admitting: Internal Medicine

## 2011-09-17 VITALS — BP 170/95

## 2011-09-17 DIAGNOSIS — I10 Essential (primary) hypertension: Secondary | ICD-10-CM

## 2011-09-17 MED ORDER — HYDRALAZINE HCL 50 MG PO TABS: 50.0000 mg | ORAL_TABLET | Freq: Two times a day (BID) | ORAL | Status: DC

## 2011-09-20 NOTE — Progress Notes (Signed)
Patient ID: Andre Thompson, male   DOB: May 12, 1945, 66 y.o.   MRN: [id number] Patient came in for a nursing visit for blood pressure check.  On examination blood pressure was too high see vital signs forms.

## 2011-09-20 NOTE — Assessment & Plan Note (Signed)
Increase hydralazine to 50 mg by mouth 3 times a day. 

## 2011-10-20 ENCOUNTER — Ambulatory Visit (INDEPENDENT_AMBULATORY_CARE_PROVIDER_SITE_OTHER): Payer: BC Managed Care – PPO | Admitting: Internal Medicine

## 2011-10-20 ENCOUNTER — Encounter: Payer: Self-pay | Admitting: Internal Medicine

## 2011-10-20 VITALS — BP 162/95 | HR 68 | Temp 98.3°F | Wt 292.0 lb

## 2011-10-20 DIAGNOSIS — I10 Essential (primary) hypertension: Secondary | ICD-10-CM

## 2011-10-20 LAB — BASIC METABOLIC PANEL
BUN: 18 mg/dL (ref 6–23)
Chloride: 108 mEq/L (ref 96–112)
GFR: 62.55 mL/min (ref >60.00)
Potassium: 3 mEq/L — ABNORMAL LOW (ref 3.5–5.1)
Sodium: 143 mEq/L (ref 135–145)

## 2011-10-20 NOTE — Progress Notes (Signed)
Patient ID: Andre Thompson, male   DOB: 21-Jan-1946, 66 y.o.   MRN: [id number]  htn-- no home bps Tolerating meds.  Gout- no recurrence  Venous insufficiency- no skin breakdown, wearing compression stockings.   Past Medical History  Diagnosis Date  . Gout   . Hyperlipidemia   . Hypertension   . Stroke   . Rhabdomyolysis     History   Social History  . Marital Status: Married    Spouse Name: N/A    Number of Children: N/A  . Years of Education: N/A   Occupational History  . Not on file.   Social History Main Topics  . Smoking status: Never Smoker   . Smokeless tobacco: Not on file  . Alcohol Use: Yes  . Drug Use: No  . Sexually Active:    Other Topics Concern  . Not on file   Social History Narrative  . No narrative on file    No past surgical history on file.  Family History  Problem Relation Age of Onset  . Hypertension Mother   . Hypertension Father   . Hypertension Sister     4 sisters  . Hypertension Brother     3 brothers  . Cancer Brother     intestine    Allergies  Allergen Reactions  . Ace Inhibitors     REACTION: angioedema--8/09 (on lotrel)  . Chlorthalidone     REACTION: gout    Current Outpatient Prescriptions on File Prior to Visit  Medication Sig Dispense Refill  . amLODipine (NORVASC) 10 MG tablet Take 1 tablet (10 mg total) by mouth daily.  90 tablet  3  . aspirin 81 MG tablet Take 81 mg by mouth daily.        . colchicine 0.6 MG tablet Take 0.6 mg by mouth daily.      . febuxostat (ULORIC) 40 MG tablet Take 1 tablet (40 mg total) by mouth daily.  90 tablet  3  . hydrALAZINE (APRESOLINE) 50 MG tablet Take 1 tablet (50 mg total) by mouth 2 (two) times daily.  180 tablet  3  . hydrochlorothiazide 25 MG tablet Take 1 tablet (25 mg total) by mouth daily.  90 tablet  3  . metoprolol (TOPROL-XL) 200 MG 24 hr tablet Take 1 tablet (200 mg total) by mouth daily.  90 tablet  3  . sildenafil (VIAGRA) 100 MG tablet Take 1 tablet (100 mg  total) by mouth daily as needed.  30 tablet  3  . telmisartan (MICARDIS) 80 MG tablet Take 1 tablet (80 mg total) by mouth daily.  90 tablet  3     patient denies chest pain, shortness of breath, orthopnea. Denies lower extremity edema, abdominal pain, change in appetite, change in bowel movements. Patient denies rashes, musculoskeletal complaints. No other specific complaints in a complete review of systems.   BP 180/110  Pulse 68  Temp(Src) 98.3 F (36.8 C) (Oral)  Wt 292 lb (132.45 kg)  well-developed well-nourished male in no acute distress. HEENT exam atraumatic, normocephalic, neck supple without jugular venous distention. Chest clear to auscultation cardiac exam S1-S2 are regular. Abdominal exam overweight with bowel sounds, soft and nontender. Extremities no edema. Neurologic exam is alert with a normal gait.

## 2011-10-20 NOTE — Assessment & Plan Note (Addendum)
Not controlled He will monitor at home  We will call next week. Check creatinine today

## 2011-10-29 ENCOUNTER — Other Ambulatory Visit: Payer: Self-pay | Admitting: *Deleted

## 2011-10-29 MED ORDER — POTASSIUM CHLORIDE CRYS ER 20 MEQ PO TBCR: 20.0000 meq | EXTENDED_RELEASE_TABLET | Freq: Every day | ORAL | Status: DC

## 2011-12-30 ENCOUNTER — Other Ambulatory Visit: Payer: Self-pay | Admitting: Internal Medicine

## 2012-07-18 ENCOUNTER — Other Ambulatory Visit: Payer: Self-pay | Admitting: Internal Medicine

## 2012-08-12 ENCOUNTER — Other Ambulatory Visit: Payer: Self-pay | Admitting: Orthopedic Surgery

## 2012-08-17 ENCOUNTER — Ambulatory Visit
Admission: RE | Admit: 2012-08-17 | Discharge: 2012-08-17 | Disposition: A | Discharge status: Home / Self Care | Payer: BC Managed Care – PPO | Source: Ambulatory Visit | Attending: Orthopedic Surgery | Admitting: Orthopedic Surgery

## 2012-10-17 ENCOUNTER — Other Ambulatory Visit (HOSPITAL_COMMUNITY): Payer: Self-pay | Admitting: Orthopedic Surgery

## 2012-10-17 ENCOUNTER — Ambulatory Visit (HOSPITAL_COMMUNITY)
Admission: RE | Admit: 2012-10-17 | Discharge: 2012-10-17 | Disposition: A | Discharge status: Home / Self Care | Payer: BC Managed Care – PPO | Source: Ambulatory Visit | Attending: Cardiovascular Disease | Admitting: Cardiovascular Disease

## 2012-10-17 DIAGNOSIS — I872 Venous insufficiency (chronic) (peripheral): Secondary | ICD-10-CM | POA: Insufficient documentation

## 2012-10-17 NOTE — Progress Notes (Signed)
Venous Duplex Completed. °Andre Thompson ° °

## 2012-12-15 ENCOUNTER — Encounter: Payer: Self-pay | Admitting: Family Medicine

## 2012-12-15 ENCOUNTER — Ambulatory Visit (INDEPENDENT_AMBULATORY_CARE_PROVIDER_SITE_OTHER): Payer: BC Managed Care – PPO | Admitting: Family Medicine

## 2012-12-15 VITALS — BP 162/84 | HR 65 | Temp 97.8°F | Wt 305.0 lb

## 2012-12-15 DIAGNOSIS — I10 Essential (primary) hypertension: Secondary | ICD-10-CM

## 2012-12-15 DIAGNOSIS — N182 Chronic kidney disease, stage 2 (mild): Secondary | ICD-10-CM

## 2012-12-15 LAB — BASIC METABOLIC PANEL
BUN: 25 mg/dL — ABNORMAL HIGH (ref 6–23)
CO2: 27 mEq/L (ref 19–32)
Calcium: 9.5 mg/dL (ref 8.4–10.5)
Chloride: 107 mEq/L (ref 96–112)
Creatinine, Ser: 1.5 mg/dL (ref 0.4–1.5)

## 2012-12-15 NOTE — Progress Notes (Signed)
  Subjective:    Patient ID: Andre Thompson, male    DOB: 01-May-1946, 67 y.o.   MRN: [id number]  HPI  Patient here for evaluation of poorly controlled hypertension. He takes multidrug regimen including amlodipine 10 mg daily, hydralazine 50 mg (taking only once daily), metoprolol 200 mg daily, and Micardis 80 mg daily. He has history of venous stasis ulcers and was at wound care center recently with blood pressure 168/102 yesterday 181/108. No headaches. No dizziness. No chest pains.  He has history of what appears to be some chronic kidney disease with previous creatinine 1.5. No labs in over one year. Watches his sodium intake. No regular nonsteroidals. Drinks about 8 ounces of 40 proof alcohol per day. Past history of hypokalemia on prior labs over one year ago He has some chronic lower extremity edema and uses support hose  Past Medical History  Diagnosis Date  . Gout   . Hyperlipidemia   . Hypertension   . Stroke   . Rhabdomyolysis    No past surgical history on file.  reports that he has never smoked. He does not have any smokeless tobacco history on file. He reports that  drinks alcohol. He reports that he does not use illicit drugs. family history includes Cancer in his brother and Hypertension in his brother, father, mother, and sister. Allergies  Allergen Reactions  . Ace Inhibitors     REACTION: angioedema--8/09 (on lotrel)  . Chlorthalidone     REACTION: gout     Review of Systems  Constitutional: Negative for fatigue.  Eyes: Negative for visual disturbance.  Respiratory: Negative for cough, chest tightness and shortness of breath.   Cardiovascular: Positive for leg swelling. Negative for chest pain and palpitations.  Endocrine: Negative for polydipsia and polyuria.  Genitourinary: Positive for genital sores. Negative for dysuria.  Neurological: Negative for dizziness, syncope, weakness, light-headedness and headaches.       Objective:   Physical Exam   Constitutional: He is oriented to person, place, and time. He appears well-developed and well-nourished.  Neck: Neck supple. No thyromegaly present.  Cardiovascular: Normal rate and regular rhythm.   Pulmonary/Chest: Effort normal and breath sounds normal. No respiratory distress. He has no wheezes. He has no rales.  Musculoskeletal:  Compression stockings in place  Neurological: He is alert and oriented to person, place, and time. No cranial nerve deficit.  Psychiatric: He has a normal mood and affect. His behavior is normal.          Assessment & Plan:  Hypertension. Suboptimal control. Check basic metabolic panel with past history of chronic kidney disease and hypokalemia. Increase hydralazine to 50 mg twice a day. Scale back or stop alcohol altogether.  Reduce sodium intake. Reassess 3 weeks with primary. Possible consideration of furosemide or spirinolactone. Avoid HCTZ with history of gout

## 2012-12-15 NOTE — Patient Instructions (Signed)
Increase hydralazine to twice daily Scale back alcohol use.

## 2013-01-05 ENCOUNTER — Other Ambulatory Visit: Payer: Self-pay

## 2013-01-05 ENCOUNTER — Encounter: Payer: Self-pay | Admitting: Family Medicine

## 2013-01-05 ENCOUNTER — Ambulatory Visit (INDEPENDENT_AMBULATORY_CARE_PROVIDER_SITE_OTHER): Payer: BC Managed Care – PPO | Admitting: Family Medicine

## 2013-01-05 VITALS — BP 158/80 | HR 62 | Temp 98.0°F | Wt 298.0 lb

## 2013-01-05 DIAGNOSIS — I10 Essential (primary) hypertension: Secondary | ICD-10-CM

## 2013-01-05 MED ORDER — HYDROCHLOROTHIAZIDE 25 MG PO TABS: 25.0000 mg | ORAL_TABLET | Freq: Every day | ORAL | Status: DC

## 2013-01-05 MED ORDER — AMLODIPINE BESYLATE 10 MG PO TABS: ORAL_TABLET | ORAL | Status: DC

## 2013-01-05 MED ORDER — FEBUXOSTAT 40 MG PO TABS: ORAL_TABLET | ORAL | Status: DC

## 2013-01-05 MED ORDER — METOPROLOL SUCCINATE ER 200 MG PO TB24: ORAL_TABLET | ORAL | Status: DC

## 2013-01-05 MED ORDER — SILDENAFIL CITRATE 100 MG PO TABS: ORAL_TABLET | ORAL | Status: DC

## 2013-01-05 MED ORDER — TELMISARTAN 80 MG PO TABS: ORAL_TABLET | ORAL | Status: DC

## 2013-01-05 MED ORDER — COLCHICINE 0.6 MG PO TABS: ORAL_TABLET | ORAL | Status: DC

## 2013-01-05 NOTE — Progress Notes (Signed)
  Subjective:    Patient ID: Andre Thompson, male    DOB: [birth year], 67 y.o.   MRN: [id number]  HPI Patient seen for followup hypertension. Refer to recent note. Patient currently on 4 drug regimen including hydralazine, amlodipine, metoprolol, and micardis . He was consuming about 8 ounces of whiskey per day and has reduced back currently to 4 ounces. We increased his hydralazine to 50 mg twice daily. Blood pressures are still running around 100 5160 systolic and 90-100 diastolic.  Generally feels well. No headaches. No dizziness. He has some mild chronic peripheral edema. Venous stasis ulcers followed by orthopedist.  He does have history of gout which is been controlled with Uloric.  We discussed possible HCTZ the with caution because of his gout history. He does have chronic kidney disease which is relatively mild. Recent creatinine 1.5 with GFR round 60  recent potassium normal  Past Medical History  Diagnosis Date  . Gout   . Hyperlipidemia   . Hypertension   . Stroke   . Rhabdomyolysis    No past surgical history on file.  reports that he has never smoked. He does not have any smokeless tobacco history on file. He reports that  drinks alcohol. He reports that he does not use illicit drugs. family history includes Cancer in his brother; Hypertension in his brother, father, mother, and sister. Allergies  Allergen Reactions  . Ace Inhibitors     REACTION: angioedema--8/09 (on lotrel)  . Chlorthalidone     REACTION: gout      Review of Systems  Constitutional: Negative for fatigue and unexpected weight change.  Eyes: Negative for visual disturbance.  Respiratory: Negative for cough, chest tightness and shortness of breath.   Cardiovascular: Negative for chest pain, palpitations and leg swelling.  Endocrine: Negative for polydipsia and polyuria.  Neurological: Negative for dizziness, syncope, weakness, light-headedness and headaches.       Objective:   Physical Exam   Constitutional: He appears well-developed and well-nourished.  Cardiovascular: Normal rate and regular rhythm.   Pulmonary/Chest: Effort normal and breath sounds normal. No respiratory distress. He has no wheezes. He has no rales.  Musculoskeletal:  Patient has support hose in place. Trace edema legs bilaterally          Assessment & Plan:  Hypertension. Suboptimally controlled. Patient on multiple medications as above with good compliance. We discussed options. Have suggested diuretic therapy. Even though he has gout, he is currently well controlled with Uloric. Cautious trial of HCTZ 25 mg once daily. May need to look at loop diuretic if his kidney functions worsens any further. Reassess blood pressure one month. Continue to scale back alcohol use

## 2013-02-06 ENCOUNTER — Ambulatory Visit (INDEPENDENT_AMBULATORY_CARE_PROVIDER_SITE_OTHER): Payer: BC Managed Care – PPO | Admitting: Family Medicine

## 2013-02-06 ENCOUNTER — Encounter: Payer: Self-pay | Admitting: Family Medicine

## 2013-02-06 VITALS — BP 148/88 | HR 52 | Temp 97.6°F | Wt 300.0 lb

## 2013-02-06 DIAGNOSIS — I10 Essential (primary) hypertension: Secondary | ICD-10-CM

## 2013-02-06 NOTE — Progress Notes (Signed)
  Subjective:    Patient ID: Andre Thompson, male    DOB: 1946/01/24, 67 y.o.   MRN: [id number]  HPI Patient here for followup hypertension. Refer to prior notes. He is already on 4 drug regimen and still had poorly controlled hypertension. Has chronic kidney disease stage II. He has gout which has been stable on Uloric.  We recently added HCTZ 25 mg once daily and he has tolerated with no recent gout flares. He is not monitoring blood pressure at home. He has reduced his alcohol consumption to no more than 4 ounces of whiskey daily Recent headaches or chest pains.  Past Medical History  Diagnosis Date  . Gout   . Hyperlipidemia   . Hypertension   . Stroke   . Rhabdomyolysis    No past surgical history on file.  reports that he has never smoked. He does not have any smokeless tobacco history on file. He reports that  drinks alcohol. He reports that he does not use illicit drugs. family history includes Cancer in his brother; Hypertension in his brother, father, mother, and sister. Allergies  Allergen Reactions  . Ace Inhibitors     REACTION: angioedema--8/09 (on lotrel)  . Chlorthalidone     REACTION: gout      Review of Systems  Constitutional: Negative for fatigue and unexpected weight change.  Eyes: Negative for visual disturbance.  Respiratory: Negative for cough, chest tightness and shortness of breath.   Cardiovascular: Negative for chest pain, palpitations and leg swelling.  Endocrine: Negative for polydipsia and polyuria.  Neurological: Negative for dizziness, syncope, weakness, light-headedness and headaches.       Objective:   Physical Exam  Constitutional: He appears well-developed and well-nourished.  Neck: Neck supple. No thyromegaly present.  Cardiovascular: Normal rate and regular rhythm.   Pulmonary/Chest: Effort normal. No respiratory distress. He has no wheezes. He has no rales.  Musculoskeletal: He exhibits no edema.          Assessment & Plan:   Hypertension. Improved. Continue regimen above. Continue to scale back alcohol consumption and continue to work on weight loss. Suggested followup with primary physician in 3 months to reassess.

## 2013-04-28 ENCOUNTER — Ambulatory Visit (INDEPENDENT_AMBULATORY_CARE_PROVIDER_SITE_OTHER): Payer: BC Managed Care – PPO | Admitting: Internal Medicine

## 2013-04-28 ENCOUNTER — Encounter: Payer: Self-pay | Admitting: Internal Medicine

## 2013-04-28 VITALS — BP 142/85 | Temp 98.0°F | Ht 75.0 in | Wt 308.0 lb

## 2013-04-28 DIAGNOSIS — N182 Chronic kidney disease, stage 2 (mild): Secondary | ICD-10-CM

## 2013-04-28 DIAGNOSIS — Z8679 Personal history of other diseases of the circulatory system: Secondary | ICD-10-CM

## 2013-04-28 DIAGNOSIS — I872 Venous insufficiency (chronic) (peripheral): Secondary | ICD-10-CM

## 2013-04-28 DIAGNOSIS — I10 Essential (primary) hypertension: Secondary | ICD-10-CM

## 2013-04-28 DIAGNOSIS — E785 Hyperlipidemia, unspecified: Secondary | ICD-10-CM

## 2013-04-28 LAB — CBC WITH DIFFERENTIAL/PLATELET
Basophils Absolute: 0 10*3/uL (ref 0.0–0.1)
Eosinophils Absolute: 0.3 10*3/uL (ref 0.0–0.7)
Eosinophils Relative: 6.9 % — ABNORMAL HIGH (ref 0.0–5.0)
MCHC: 34.5 g/dL (ref 30.0–36.0)
MCV: 92.1 fl (ref 78.0–100.0)
Monocytes Absolute: 0.5 10*3/uL (ref 0.1–1.0)
Neutrophils Relative %: 59.5 % (ref 43.0–77.0)
Platelets: 213 10*3/uL (ref 150.0–400.0)
WBC: 4.8 10*3/uL (ref 4.5–10.5)

## 2013-04-28 LAB — LIPID PANEL
Cholesterol: 234 mg/dL — ABNORMAL HIGH (ref 0–200)
Triglycerides: 247 mg/dL — ABNORMAL HIGH (ref 0.0–149.0)

## 2013-04-28 LAB — HEPATIC FUNCTION PANEL
ALT: 33 U/L (ref 0–53)
AST: 39 U/L — ABNORMAL HIGH (ref 0–37)
Total Bilirubin: 1.1 mg/dL (ref 0.3–1.2)

## 2013-04-28 LAB — BASIC METABOLIC PANEL
BUN: 22 mg/dL (ref 6–23)
Chloride: 102 mEq/L (ref 96–112)
Glucose, Bld: 102 mg/dL — ABNORMAL HIGH (ref 70–99)
Potassium: 3.2 mEq/L — ABNORMAL LOW (ref 3.5–5.1)

## 2013-04-28 NOTE — Progress Notes (Signed)
Pre visit review using our clinic review tool, if applicable. No additional management support is needed unless otherwise documented below in the visit note. 

## 2013-04-28 NOTE — Progress Notes (Signed)
htn  Venous stasis ulcer (duda)  Gout- no recurrence  Past Medical History  Diagnosis Date  . Gout   . Hyperlipidemia   . Hypertension   . Stroke   . Rhabdomyolysis     History   Social History  . Marital Status: Married    Spouse Name: N/A    Number of Children: N/A  . Years of Education: N/A   Occupational History  . Not on file.   Social History Main Topics  . Smoking status: Never Smoker   . Smokeless tobacco: Not on file  . Alcohol Use: Yes  . Drug Use: No  . Sexual Activity:    Other Topics Concern  . Not on file   Social History Narrative  . No narrative on file    No past surgical history on file.  Family History  Problem Relation Age of Onset  . Hypertension Mother   . Hypertension Father   . Hypertension Sister     4 sisters  . Hypertension Brother     3 brothers  . Cancer Brother     intestine    Allergies  Allergen Reactions  . Ace Inhibitors     REACTION: angioedema--8/09 (on lotrel)  . Chlorthalidone     REACTION: gout    Current Outpatient Prescriptions on File Prior to Visit  Medication Sig Dispense Refill  . amLODipine (NORVASC) 10 MG tablet TAKE 1 TABLET DAILY  90 tablet  3  . aspirin 81 MG tablet Take 81 mg by mouth daily.        . colchicine (COLCRYS) 0.6 MG tablet TAKE 1 TABLET TWICE A DAY  180 tablet  3  . febuxostat (ULORIC) 40 MG tablet TAKE 1 TABLET DAILY  90 tablet  3  . hydrochlorothiazide (HYDRODIURIL) 25 MG tablet Take 1 tablet (25 mg total) by mouth daily.  90 tablet  3  . metoprolol (TOPROL-XL) 200 MG 24 hr tablet TAKE 1 TABLET DAILY  90 tablet  3  . sildenafil (VIAGRA) 100 MG tablet TAKE 1 TABLET DAILY AS     NEEDED  18 tablet  3  . telmisartan (MICARDIS) 80 MG tablet TAKE 1 TABLET DAILY  90 tablet  3  . hydrALAZINE (APRESOLINE) 50 MG tablet Take 1 tablet (50 mg total) by mouth 2 (two) times daily.  180 tablet  3   No current facility-administered medications on file prior to visit.     patient denies chest  pain, shortness of breath, orthopnea. Denies lower extremity edema, abdominal pain, change in appetite, change in bowel movements. Patient denies rashes, musculoskeletal complaints. No other specific complaints in a complete review of systems.   BP 152/90  Temp(Src) 98 F (36.7 C) (Oral)  Ht 6\' 3"  (1.905 m)  Wt 308 lb (139.708 kg)  BMI 38.50 kg/m2  well-developed well-nourished male in no acute distress. HEENT exam atraumatic, normocephalic, neck supple without jugular venous distention. Chest clear to auscultation cardiac exam S1-S2 are regular. Abdominal exam overweight with bowel sounds, soft and nontender. Extremities no edema. Neurologic exam is alert with a normal gait.

## 2013-04-30 NOTE — Assessment & Plan Note (Signed)
All wounds have healed. Continue compression stockings

## 2013-04-30 NOTE — Assessment & Plan Note (Signed)
Lipid Panel     Component Value Date/Time   CHOL 234* 04/28/2013 0933   TRIG 247.0* 04/28/2013 0933   HDL 39.10 04/28/2013 0933   CHOLHDL 6 04/28/2013 0933   VLDL 49.4* 04/28/2013 0933   LDLCALC 125* 02/25/2009 0905   Will check today

## 2013-04-30 NOTE — Assessment & Plan Note (Signed)
BP Readings from Last 3 Encounters:  04/28/13 142/85  02/06/13 148/88  01/05/13 158/80  reasonable control. Continue meds.  Certainly bp is better than it has been

## 2013-05-01 ENCOUNTER — Other Ambulatory Visit: Payer: Self-pay | Admitting: *Deleted

## 2013-05-01 MED ORDER — ATORVASTATIN CALCIUM 20 MG PO TABS: 20.0000 mg | ORAL_TABLET | Freq: Every day | ORAL | Status: DC

## 2013-05-01 MED ORDER — POTASSIUM CHLORIDE CRYS ER 20 MEQ PO TBCR: 20.0000 meq | EXTENDED_RELEASE_TABLET | Freq: Every day | ORAL | Status: DC

## 2013-05-08 ENCOUNTER — Ambulatory Visit: Payer: BC Managed Care – PPO | Admitting: Internal Medicine

## 2013-06-01 ENCOUNTER — Other Ambulatory Visit (INDEPENDENT_AMBULATORY_CARE_PROVIDER_SITE_OTHER): Payer: BC Managed Care – PPO

## 2013-06-01 DIAGNOSIS — E785 Hyperlipidemia, unspecified: Secondary | ICD-10-CM

## 2013-06-01 LAB — LIPID PANEL
Cholesterol: 150 mg/dL (ref 0–200)
HDL: 43.7 mg/dL (ref >39.00)
Total CHOL/HDL Ratio: 3
Triglycerides: 284 mg/dL — ABNORMAL HIGH (ref 0.0–149.0)
VLDL: 56.8 mg/dL — ABNORMAL HIGH (ref 0.0–40.0)

## 2013-06-01 LAB — BASIC METABOLIC PANEL
BUN: 20 mg/dL (ref 6–23)
CALCIUM: 9.5 mg/dL (ref 8.4–10.5)
CO2: 28 mEq/L (ref 19–32)
Chloride: 103 mEq/L (ref 96–112)
Creatinine, Ser: 1.7 mg/dL — ABNORMAL HIGH (ref 0.4–1.5)
GFR: 50.77 mL/min — ABNORMAL LOW (ref >60.00)
GLUCOSE: 98 mg/dL (ref 70–99)
Potassium: 3.5 mEq/L (ref 3.5–5.1)
SODIUM: 140 meq/L (ref 135–145)

## 2013-06-01 LAB — LDL CHOLESTEROL, DIRECT: LDL DIRECT: 75 mg/dL

## 2013-10-25 ENCOUNTER — Ambulatory Visit (INDEPENDENT_AMBULATORY_CARE_PROVIDER_SITE_OTHER): Payer: BC Managed Care – PPO | Admitting: Internal Medicine

## 2013-10-25 ENCOUNTER — Encounter: Payer: Self-pay | Admitting: Internal Medicine

## 2013-10-25 VITALS — BP 138/85 | HR 70 | Temp 97.9°F | Ht 75.0 in | Wt 309.0 lb

## 2013-10-25 DIAGNOSIS — N289 Disorder of kidney and ureter, unspecified: Secondary | ICD-10-CM

## 2013-10-25 DIAGNOSIS — I10 Essential (primary) hypertension: Secondary | ICD-10-CM

## 2013-10-25 DIAGNOSIS — N182 Chronic kidney disease, stage 2 (mild): Secondary | ICD-10-CM

## 2013-10-25 DIAGNOSIS — E785 Hyperlipidemia, unspecified: Secondary | ICD-10-CM

## 2013-10-25 LAB — BASIC METABOLIC PANEL
BUN: 27 mg/dL — ABNORMAL HIGH (ref 6–23)
CHLORIDE: 104 meq/L (ref 96–112)
CO2: 30 mEq/L (ref 19–32)
CREATININE: 1.9 mg/dL — AB (ref 0.4–1.5)
Calcium: 9.7 mg/dL (ref 8.4–10.5)
GFR: 46.93 mL/min — AB (ref >60.00)
Glucose, Bld: 111 mg/dL — ABNORMAL HIGH (ref 70–99)
Potassium: 3.8 mEq/L (ref 3.5–5.1)
Sodium: 141 mEq/L (ref 135–145)

## 2013-10-25 NOTE — Progress Notes (Signed)
Pre visit review using our clinic review tool, if applicable. No additional management support is needed unless otherwise documented below in the visit note. 

## 2013-10-25 NOTE — Progress Notes (Signed)
htn- tolerating meds. No home bps  Venous insufficiency-- wears compressive stockings with good results. Still sees dr duda for f/u because of previous non healing ulcer.   Lipids-- previous control of ldl

## 2013-10-26 ENCOUNTER — Telehealth: Payer: Self-pay | Admitting: Internal Medicine

## 2013-10-26 NOTE — Telephone Encounter (Signed)
Relevant patient education mailed to patient.  

## 2013-10-28 NOTE — Assessment & Plan Note (Signed)
BP: 138/85 mmHg  Fair control He will monitor at home

## 2013-10-28 NOTE — Assessment & Plan Note (Signed)
Previously controlled 

## 2013-10-28 NOTE — Assessment & Plan Note (Signed)
Basic Metabolic Panel:    Component Value Date/Time   NA 141 10/25/2013 0926   K 3.8 10/25/2013 0926   CL 104 10/25/2013 0926   CO2 30 10/25/2013 0926   BUN 27* 10/25/2013 0926   CREATININE 1.9* 10/25/2013 0926   GLUCOSE 111* 10/25/2013 0926   CALCIUM 9.7 10/25/2013 0926   Will need to continue to follow renal function Will need to address htn regularly

## 2013-10-31 ENCOUNTER — Other Ambulatory Visit: Payer: Self-pay | Admitting: Internal Medicine

## 2013-10-31 DIAGNOSIS — N289 Disorder of kidney and ureter, unspecified: Secondary | ICD-10-CM

## 2014-02-17 ENCOUNTER — Other Ambulatory Visit: Payer: Self-pay | Admitting: Family Medicine

## 2014-03-28 ENCOUNTER — Telehealth: Payer: Self-pay | Admitting: Internal Medicine

## 2014-03-28 NOTE — Telephone Encounter (Signed)
Pt request refill of the following: colchicine (COLCRYS) 0.6 MG tablet,telmisartan (MICARDIS) 80 MG tablet,metoprolol (TOPROL-XL) 200 MG 24 hr tablet,sildenafil (VIAGRA) 100 MG tablet   Phamacy:    [facility]

## 2014-03-29 MED ORDER — METOPROLOL SUCCINATE ER 200 MG PO TB24: ORAL_TABLET | ORAL | Status: DC

## 2014-03-29 MED ORDER — TELMISARTAN 80 MG PO TABS: ORAL_TABLET | ORAL | Status: DC

## 2014-03-29 MED ORDER — COLCHICINE 0.6 MG PO TABS: ORAL_TABLET | ORAL | Status: DC

## 2014-03-29 NOTE — Telephone Encounter (Signed)
rx sent in electronically 

## 2014-04-18 ENCOUNTER — Encounter: Payer: Self-pay | Admitting: Family Medicine

## 2014-04-18 DIAGNOSIS — N529 Male erectile dysfunction, unspecified: Secondary | ICD-10-CM | POA: Insufficient documentation

## 2014-05-25 ENCOUNTER — Other Ambulatory Visit: Payer: Self-pay | Admitting: Internal Medicine

## 2014-08-05 ENCOUNTER — Other Ambulatory Visit: Payer: Self-pay | Admitting: Family Medicine

## 2014-08-30 ENCOUNTER — Encounter: Payer: Self-pay | Admitting: Family Medicine

## 2014-08-30 ENCOUNTER — Ambulatory Visit (INDEPENDENT_AMBULATORY_CARE_PROVIDER_SITE_OTHER): Payer: BLUE CROSS/BLUE SHIELD | Admitting: Family Medicine

## 2014-08-30 VITALS — BP 140/80 | HR 55 | Temp 98.2°F | Wt 319.0 lb

## 2014-08-30 DIAGNOSIS — Z23 Encounter for immunization: Secondary | ICD-10-CM | POA: Diagnosis not present

## 2014-08-30 DIAGNOSIS — I10 Essential (primary) hypertension: Secondary | ICD-10-CM | POA: Diagnosis not present

## 2014-08-30 DIAGNOSIS — N183 Chronic kidney disease, stage 3 unspecified: Secondary | ICD-10-CM

## 2014-08-30 DIAGNOSIS — I499 Cardiac arrhythmia, unspecified: Secondary | ICD-10-CM

## 2014-08-30 DIAGNOSIS — N5201 Erectile dysfunction due to arterial insufficiency: Secondary | ICD-10-CM | POA: Diagnosis not present

## 2014-08-30 DIAGNOSIS — E785 Hyperlipidemia, unspecified: Secondary | ICD-10-CM

## 2014-08-30 DIAGNOSIS — M109 Gout, unspecified: Secondary | ICD-10-CM | POA: Diagnosis not present

## 2014-08-30 LAB — COMPREHENSIVE METABOLIC PANEL
ALBUMIN: 4.2 g/dL (ref 3.5–5.2)
ALT: 34 U/L (ref 0–53)
AST: 38 U/L — ABNORMAL HIGH (ref 0–37)
Alkaline Phosphatase: 102 U/L (ref 39–117)
BUN: 14 mg/dL (ref 6–23)
CALCIUM: 9.5 mg/dL (ref 8.4–10.5)
CHLORIDE: 105 meq/L (ref 96–112)
CO2: 27 meq/L (ref 19–32)
CREATININE: 1.5 mg/dL (ref 0.40–1.50)
GFR: 59.63 mL/min — AB (ref >60.00)
GLUCOSE: 91 mg/dL (ref 70–99)
Potassium: 3.5 mEq/L (ref 3.5–5.1)
Sodium: 139 mEq/L (ref 135–145)
Total Bilirubin: 0.9 mg/dL (ref 0.2–1.2)
Total Protein: 7.7 g/dL (ref 6.0–8.3)

## 2014-08-30 LAB — CBC
HCT: 44.6 % (ref 39.0–52.0)
Hemoglobin: 15.3 g/dL (ref 13.0–17.0)
MCHC: 34.2 g/dL (ref 30.0–36.0)
MCV: 90.3 fl (ref 78.0–100.0)
Platelets: 195 10*3/uL (ref 150.0–400.0)
RBC: 4.95 Mil/uL (ref 4.22–5.81)
RDW: 13.9 % (ref 11.5–15.5)
WBC: 5.2 10*3/uL (ref 4.0–10.5)

## 2014-08-30 LAB — URIC ACID: Uric Acid, Serum: 6.6 mg/dL (ref 4.0–7.8)

## 2014-08-30 LAB — TSH: TSH: 0.86 u[IU]/mL (ref 0.35–4.50)

## 2014-08-30 LAB — LDL CHOLESTEROL, DIRECT: LDL DIRECT: 154 mg/dL

## 2014-08-30 MED ORDER — AMLODIPINE BESYLATE 10 MG PO TABS: 10.0000 mg | ORAL_TABLET | Freq: Every day | ORAL | Status: DC

## 2014-08-30 MED ORDER — HYDRALAZINE HCL 50 MG PO TABS: 50.0000 mg | ORAL_TABLET | Freq: Two times a day (BID) | ORAL | Status: DC

## 2014-08-30 MED ORDER — COLCHICINE 0.6 MG PO TABS
ORAL_TABLET | ORAL | Status: DC
Start: 2014-08-30 — End: 2015-11-11

## 2014-08-30 MED ORDER — FEBUXOSTAT 40 MG PO TABS: 40.0000 mg | ORAL_TABLET | Freq: Every day | ORAL | Status: DC

## 2014-08-30 MED ORDER — ATORVASTATIN CALCIUM 20 MG PO TABS: ORAL_TABLET | ORAL | Status: DC

## 2014-08-30 MED ORDER — TADALAFIL 20 MG PO TABS: 10.0000 mg | ORAL_TABLET | ORAL | Status: AC | PRN

## 2014-08-30 MED ORDER — TELMISARTAN-HCTZ 80-25 MG PO TABS: 1.0000 | ORAL_TABLET | Freq: Every day | ORAL | Status: DC

## 2014-08-30 MED ORDER — METOPROLOL SUCCINATE ER 200 MG PO TB24: ORAL_TABLET | ORAL | Status: DC

## 2014-08-30 NOTE — Progress Notes (Signed)
Tana Conch, MD Phone: [phone]  Subjective:  Patient presents today to establish care with me as their new primary care provider. Patient was formerly a patient of Dr. Cato Mulligan. Chief complaint-noted.   Hypertension-mild poor control given CKD and hx stroke CKD Stage III- stable BP Readings from Last 3 Encounters:  08/30/14 140/80  10/25/13 138/85  04/28/13 142/85   Home BP monitoring-no Compliant with medications-yes without side effects ROS-Denies any CP, HA, SOB, blurry vision, LE edema.  Hyperlipidemia-poor control with LDL up to 154 today up from 75 a year ago  On statin: reports takign atorvastatin  Regular exercise: no, advised ROS- no chest pain or shortness of breath. No myalgias  The following were reviewed and entered/updated in epic: Past Medical History  Diagnosis Date  . Gout   . Hyperlipidemia   . Hypertension   . Stroke   . Rhabdomyolysis   . DIVERTICULOSIS, COLON 04/30/2009    Qualifier: Diagnosis of  By: Everett Graff     Patient Active Problem List   Diagnosis Date Noted  . History of cardiovascular disorder 03/07/2007    Priority: High  . CKD (chronic kidney disease), stage III 12/15/2012    Priority: Medium  . Hyperlipidemia 03/07/2007    Priority: Medium  . Gout 03/07/2007    Priority: Medium  . Essential hypertension 03/07/2007    Priority: Medium  . Erectile dysfunction 04/18/2014    Priority: Low  . Venous (peripheral) insufficiency 07/15/2007    Priority: Low   Past Surgical History  Procedure Laterality Date  . Left ankle surgery      ligament reattached    Family History  Problem Relation Age of Onset  . Hypertension Mother   . Hypertension Father   . Hypertension Sister     4 sisters  . Hypertension Brother     3 brothers  . Cancer Brother     intestine    Medications- reviewed and updated Current Outpatient Prescriptions  Medication Sig Dispense Refill  . amLODipine (NORVASC) 10 MG tablet TAKE 1 TABLET  DAILY. 90 tablet 0  . aspirin 81 MG tablet Take 81 mg by mouth daily.      Marland Kitchen atorvastatin (LIPITOR) 20 MG tablet TAKE 1 TABLET (20 MG TOTAL) BY MOUTH DAILY. 90 tablet 1  . colchicine (COLCRYS) 0.6 MG tablet TAKE 1 TABLET TWICE A DAY 180 tablet 1  . hydrALAZINE (APRESOLINE) 50 MG tablet Take 1 tablet (50 mg total) by mouth 2 (two) times daily. 180 tablet 3  . hydrochlorothiazide (HYDRODIURIL) 25 MG tablet TAKE 1 TABLET DAILY. 90 tablet 0  . KLOR-CON M20 20 MEQ tablet TAKE 1 TABLET (20 MEQ TOTAL) BY MOUTH DAILY. 90 tablet 1  . metoprolol (TOPROL-XL) 200 MG 24 hr tablet TAKE 1 TABLET DAILY 90 tablet 1  . telmisartan (MICARDIS) 80 MG tablet TAKE 1 TABLET DAILY 90 tablet 1  . ULORIC 40 MG tablet TAKE 1 TABLET DAILY. 90 tablet 0   Allergies-reviewed and updated Allergies  Allergen Reactions  . Ace Inhibitors     REACTION: angioedema--8/09 (on lotrel)  . Chlorthalidone     REACTION: gout    History   Social History  . Marital Status: Married    Spouse Name: N/A  . Number of Children: N/A  . Years of Education: N/A   Social History Main Topics  . Smoking status: Never Smoker   . Smokeless tobacco: Not on file  . Alcohol Use: 7.2 oz/week    12 Standard drinks or  equivalent per week  . Drug Use: No  . Sexual Activity: Not on file   Other Topics Concern  . None   Social History Narrative   Married (wife patient elsewhere) about 10 years in 2016, 2 step children, 6 step grandkids.       Retired from [employer]: golfing, basketball    ROS--See HPI   Objective: BP 140/80 mmHg  Pulse 55  Temp(Src) 98.2 F (36.8 C)  Wt 319 lb (144.697 kg) Gen: NAD, resting comfortably Muddy sclera CV: RRR no murmurs rubs or gallops Lungs: CTAB no crackles, wheeze, rhonchi Abdomen: soft/nontender/nondistended/normal bowel sounds. No rebound or guarding. Obese.  Ext: no edema Skin: warm, dry Neuro: grossly normal, moves all extremities, PERRLA   EKG:  sinus rhythm rate 65 with occasional PVC, left axis, anterior fasicular block, may have LVH as well Assessment/Plan:  Hyperlipidemia Previously controlled on atorvastatin with LDL 75 a year ago now up to 154. Patient reports compliance but this is concerning. Reach out to patient and if truly is taking, will need to increase atorvastatin to  with follow up at least 6 weeks later   Essential hypertension Mild poor control on Amlodipine , hydralazine  daily , telmisartan80mg -hydrochlorothiazide , metoprolol  XL. Instructed to take hydralazine at least BID (really better as TID drug), dash diet, exercise and follow up within 3 months.   Potassium at 3.5 despite being on ARB so continue supplement   CKD (chronic kidney disease), stage III Stable control but need to better control BP as well as lipids.    Gout Never flared since on uloric. Uric acid slightly high but no changes planned unless flares occur.    Please note patient also had an irregular heart beat with occasional ectopic beat, EKG showed occasional PVC and patient asymptomatic so no further workup planned  3 month  Orders Placed This Encounter  Procedures  . Pneumococcal conjugate vaccine 13-valent  . LDL cholesterol, direct    [place]  . CBC    [place]  . Comprehensive metabolic panel    [place]  . TSH    [place]  . Uric Acid  . EKG 12-Lead    Meds ordered this encounter  Medications  . telmisartan-hydrochlorothiazide (MICARDIS HCT) 80-25 MG per tablet    Sig: Take 1 tablet by mouth daily.    Dispense:  90 tablet    Refill:  3  . tadalafil (CIALIS) 20 MG tablet    Sig: Take 0.5-1 tablets (10-20 mg total) by mouth every other day as needed for erectile dysfunction.    Dispense:  15 tablet    Refill:  3  . hydrALAZINE (APRESOLINE) 50 MG tablet    Sig: Take 1 tablet (50 mg total) by mouth 2 (two) times daily.    Dispense:  180 tablet    Refill:  3  . febuxostat (ULORIC) 40 MG tablet     Sig: Take 1 tablet (40 mg total) by mouth daily.    Dispense:  90 tablet    Refill:  3  . metoprolol (TOPROL-XL) 200 MG 24 hr tablet    Sig: TAKE 1 TABLET DAILY    Dispense:  90 tablet    Refill:  3  . colchicine (COLCRYS) 0.6 MG tablet    Sig: TAKE 1 TABLET TWICE A DAY prn    Dispense:  180 tablet    Refill:  1  . atorvastatin (LIPITOR) 20 MG tablet  Sig: TAKE 1 TABLET (20 MG TOTAL) BY MOUTH DAILY.    Dispense:  90 tablet    Refill:  3  . amLODipine (NORVASC) 10 MG tablet    Sig: Take 1 tablet (10 mg total) by mouth daily.    Dispense:  90 tablet    Refill:  3

## 2014-08-30 NOTE — Patient Instructions (Addendum)
Received final pneumonia shot today ([id number]).  You had some extra beats but no dangerous changes on EKG such as atrial fibrillation  Update labs today.   Let's try to walk at least 3x a week for 20 minutes to start, also work on [employer] Care[employer]ASH eating plan below. Also take the hydralazine at least twice a day instead of once a day.   Follow up in 3months for BP recheck  DASH Eating Plan DASH stands for "Dietary Approaches to Stop Hypertension." The DASH eating plan is a healthy eating plan that has been shown to reduce high blood pressure (hypertension). Additional health benefits may include reducing the risk of type 2 diabetes mellitus, heart disease, and stroke. The DASH eating plan may also help with weight loss. WHAT DO I NEED TO KNOW ABOUT THE DASH EATING PLAN? For the DASH eating plan, you will follow these general guidelines:  Choose foods with a percent daily value for sodium of less than 5% (as listed on the food label).  Use salt-free seasonings or herbs instead of table salt or sea salt.  Check with your health care provider or pharmacist before using salt substitutes.  Eat lower-sodium products, often labeled as "lower sodium" or "no salt added."  Eat fresh foods.  Eat more vegetables, fruits, and low-fat dairy products.  Choose whole grains. Look for the word "whole" as the first word in the ingredient list.  Choose fish and skinless chicken or [place] more often than red meat. Limit fish, poultry, and meat to 6 oz (170 g) each day.  Limit sweets, desserts, sugars, and sugary drinks.  Choose heart-healthy fats.  Limit cheese to 1 oz (28 g) per day.  Eat more home-cooked food and less restaurant, buffet, and fast food.  Limit fried foods.  Cook foods using methods other than frying.  Limit canned vegetables. If you do use them, rinse them well to decrease the sodium.  When eating at a restaurant, ask that your food be prepared with less salt, or no salt if  possible. WHAT FOODS CAN I EAT? Seek help from a dietitian for individual calorie needs. Grains Whole grain or whole wheat bread. Brown rice. Whole grain or whole wheat pasta. Quinoa, bulgur, and whole grain cereals. Low-sodium cereals. Corn or whole wheat flour tortillas. Whole grain cornbread. Whole grain crackers. Low-sodium crackers. Vegetables Fresh or frozen vegetables (raw, steamed, roasted, or grilled). Low-sodium or reduced-sodium tomato and vegetable juices. Low-sodium or reduced-sodium tomato sauce and paste. Low-sodium or reduced-sodium canned vegetables.  Fruits All fresh, canned (in natural juice), or frozen fruits. Meat and Other Protein Products Ground beef (85% or leaner), grass-fed beef, or beef trimmed of fat. Skinless chicken or [place]. Ground chicken or [place]. Pork trimmed of fat. All fish and seafood. Eggs. Dried beans, peas, or lentils. Unsalted nuts and seeds. Unsalted canned beans. Dairy Low-fat dairy products, such as skim or 1% milk, 2% or reduced-fat cheeses, low-fat ricotta or cottage cheese, or plain low-fat yogurt. Low-sodium or reduced-sodium cheeses. Fats and Oils Tub margarines without trans fats. Light or reduced-fat mayonnaise and salad dressings (reduced sodium). Avocado. Safflower, olive, or canola oils. Natural peanut or almond butter. Other Unsalted popcorn and pretzels. The items listed above may not be a complete list of recommended foods or beverages. Contact your dietitian for more options. WHAT FOODS ARE NOT RECOMMENDED? Grains White bread. White pasta. White rice. Refined cornbread. Bagels and croissants. Crackers that contain trans fat. Vegetables Creamed or fried vegetables. Vegetables in a cheese sauce. Regular  canned vegetables. Regular canned tomato sauce and paste. Regular tomato and vegetable juices. Fruits Dried fruits. Canned fruit in light or heavy syrup. Fruit juice. Meat and Other Protein Products Fatty cuts of meat. Ribs, chicken  wings, bacon, sausage, bologna, salami, chitterlings, fatback, hot dogs, bratwurst, and packaged luncheon meats. Salted nuts and seeds. Canned beans with salt. Dairy Whole or 2% milk, cream, half-and-half, and cream cheese. Whole-fat or sweetened yogurt. Full-fat cheeses or blue cheese. Nondairy creamers and whipped toppings. Processed cheese, cheese spreads, or cheese curds. Condiments Onion and garlic salt, seasoned salt, table salt, and sea salt. Canned and packaged gravies. Worcestershire sauce. Tartar sauce. Barbecue sauce. Teriyaki sauce. Soy sauce, including reduced sodium. Steak sauce. Fish sauce. Oyster sauce. Cocktail sauce. Horseradish. Ketchup and mustard. Meat flavorings and tenderizers. Bouillon cubes. Hot sauce. Tabasco sauce. Marinades. Taco seasonings. Relishes. Fats and Oils Butter, stick margarine, lard, shortening, ghee, and bacon fat. Coconut, palm kernel, or palm oils. Regular salad dressings. Other Pickles and olives. Salted popcorn and pretzels. The items listed above may not be a complete list of foods and beverages to avoid. Contact your dietitian for more information. WHERE CAN I FIND MORE INFORMATION? National Heart, Lung, and Blood Institute: [employer] Document Released: 04/16/2011 Document Revised: 09/11/2013 Document Reviewed: 03/01/2013 [facility] Patient Information 2015 [place], [place]. This information is not intended to replace advice given to you by your health care provider. Make sure you discuss any questions you have with your health care provider.

## 2014-08-31 DIAGNOSIS — I499 Cardiac arrhythmia, unspecified: Secondary | ICD-10-CM | POA: Insufficient documentation

## 2014-08-31 MED ORDER — ATORVASTATIN CALCIUM 40 MG PO TABS: 40.0000 mg | ORAL_TABLET | Freq: Every day | ORAL | Status: DC

## 2014-08-31 NOTE — Addendum Note (Signed)
Addended by: Lieutenant DiegoHINES, Syra Sirmons A on: 08/31/2014 09:54 AM   Modules accepted: Orders

## 2014-08-31 NOTE — Assessment & Plan Note (Signed)
Stable control but need to better control BP as well as lipids.

## 2014-08-31 NOTE — Assessment & Plan Note (Signed)
Mild poor control on Amlodipine 10mg , hydralazine 50mg  daily , telmisartan80mg -hydrochlorothiazide 25mg , metoprolol 200mg  XL. Instructed to take hydralazine at least BID (really better as TID drug), dash diet, exercise and follow up within 3 months.   Potassium at 3.5 despite being on ARB so continue supplement

## 2014-08-31 NOTE — Assessment & Plan Note (Signed)
Never flared since on uloric. Uric acid slightly high but no changes planned unless flares occur.

## 2014-08-31 NOTE — Assessment & Plan Note (Signed)
Previously controlled on atorvastatin with LDL 75 a year ago now up to 154. Patient reports compliance but this is concerning. Reach out to patient and if truly is taking, will need to increase atorvastatin to 40mg  with follow up at least 6 weeks later

## 2014-10-30 ENCOUNTER — Other Ambulatory Visit: Payer: Self-pay | Admitting: Internal Medicine

## 2014-11-17 ENCOUNTER — Emergency Department (HOSPITAL_COMMUNITY)
Admission: EM | Admit: 2014-11-17 | Discharge: 2014-11-17 | Disposition: A | Discharge status: Home / Self Care | Payer: BLUE CROSS/BLUE SHIELD | Attending: Emergency Medicine | Admitting: Emergency Medicine

## 2014-11-17 ENCOUNTER — Encounter (HOSPITAL_COMMUNITY): Payer: Self-pay | Admitting: Emergency Medicine

## 2014-11-17 ENCOUNTER — Emergency Department (HOSPITAL_COMMUNITY): Payer: BLUE CROSS/BLUE SHIELD

## 2014-11-17 DIAGNOSIS — S01422A Laceration with foreign body of left cheek and temporomandibular area, initial encounter: Secondary | ICD-10-CM | POA: Insufficient documentation

## 2014-11-17 DIAGNOSIS — I10 Essential (primary) hypertension: Secondary | ICD-10-CM | POA: Insufficient documentation

## 2014-11-17 DIAGNOSIS — W19XXXA Unspecified fall, initial encounter: Secondary | ICD-10-CM

## 2014-11-17 DIAGNOSIS — M109 Gout, unspecified: Secondary | ICD-10-CM | POA: Insufficient documentation

## 2014-11-17 DIAGNOSIS — Y998 Other external cause status: Secondary | ICD-10-CM | POA: Insufficient documentation

## 2014-11-17 DIAGNOSIS — Z8719 Personal history of other diseases of the digestive system: Secondary | ICD-10-CM | POA: Diagnosis not present

## 2014-11-17 DIAGNOSIS — Y9389 Activity, other specified: Secondary | ICD-10-CM | POA: Diagnosis not present

## 2014-11-17 DIAGNOSIS — S0993XA Unspecified injury of face, initial encounter: Secondary | ICD-10-CM | POA: Diagnosis present

## 2014-11-17 DIAGNOSIS — S0181XA Laceration without foreign body of other part of head, initial encounter: Secondary | ICD-10-CM

## 2014-11-17 DIAGNOSIS — Y288XXA Contact with other sharp object, undetermined intent, initial encounter: Secondary | ICD-10-CM | POA: Insufficient documentation

## 2014-11-17 DIAGNOSIS — W06XXXA Fall from bed, initial encounter: Secondary | ICD-10-CM | POA: Insufficient documentation

## 2014-11-17 DIAGNOSIS — Y92092 Bedroom in other non-institutional residence as the place of occurrence of the external cause: Secondary | ICD-10-CM | POA: Insufficient documentation

## 2014-11-17 DIAGNOSIS — E785 Hyperlipidemia, unspecified: Secondary | ICD-10-CM | POA: Diagnosis not present

## 2014-11-17 DIAGNOSIS — Z7982 Long term (current) use of aspirin: Secondary | ICD-10-CM | POA: Insufficient documentation

## 2014-11-17 DIAGNOSIS — Z79899 Other long term (current) drug therapy: Secondary | ICD-10-CM | POA: Diagnosis not present

## 2014-11-17 DIAGNOSIS — Z8673 Personal history of transient ischemic attack (TIA), and cerebral infarction without residual deficits: Secondary | ICD-10-CM | POA: Diagnosis not present

## 2014-11-17 MED ORDER — LIDOCAINE-EPINEPHRINE 1 %-1:100000 IJ SOLN
10.0000 mL | Freq: Once | INTRAMUSCULAR | Status: AC
  Administered 2014-11-17: 10 mL
  Filled 2014-11-17: qty 1

## 2014-11-17 MED ORDER — HYDROCODONE-ACETAMINOPHEN 5-325 MG PO TABS: 1.0000 | ORAL_TABLET | Freq: Four times a day (QID) | ORAL | Status: DC | PRN

## 2014-11-17 NOTE — ED Provider Notes (Signed)
CSN: [id number]     Arrival date & time 11/17/14  0532 History   None    Chief Complaint  Patient presents with  . Facial Laceration     (Consider location/radiation/quality/duration/timing/severity/associated sxs/prior Treatment) HPI Comments: Pt comes in for a laceration after rolling out of bed this morning and hitting a dresser. Denies loc. He denies numbness or weakness. Denies vision problems. Tetanus is utd.   The history is provided by the patient. No language interpreter was used.    Past Medical History  Diagnosis Date  . Gout   . Hyperlipidemia   . Hypertension   . Stroke   . Rhabdomyolysis   . DIVERTICULOSIS, COLON 04/30/2009    Qualifier: Diagnosis of  By: Everett Graff     Past Surgical History  Procedure Laterality Date  . Left ankle surgery      ligament reattached   Family History  Problem Relation Age of Onset  . Hypertension Mother   . Hypertension Father   . Hypertension Sister     4 sisters  . Hypertension Brother     3 brothers  . Cancer Brother     intestine   History  Substance Use Topics  . Smoking status: Never Smoker   . Smokeless tobacco: Not on file  . Alcohol Use: 7.2 oz/week    12 Standard drinks or equivalent per week    Review of Systems  All other systems reviewed and are negative.     Allergies  Ace inhibitors and Chlorthalidone  Home Medications   Prior to Admission medications   Medication Sig Start Date End Date Taking? Authorizing Provider  amLODipine (NORVASC) 10 MG tablet Take 1 tablet (10 mg total) by mouth daily. 08/30/14   Shelva Majestic, MD  aspirin 81 MG tablet Take 81 mg by mouth daily.      Historical Provider, MD  atorvastatin (LIPITOR) 40 MG tablet Take 1 tablet (40 mg total) by mouth daily. 08/31/14   Shelva Majestic, MD  colchicine (COLCRYS) 0.6 MG tablet TAKE 1 TABLET TWICE A DAY prn 08/30/14   Shelva Majestic, MD  febuxostat (ULORIC) 40 MG tablet Take 1 tablet (40 mg total) by mouth daily.  08/30/14   Shelva Majestic, MD  hydrALAZINE (APRESOLINE) 50 MG tablet Take 1 tablet (50 mg total) by mouth 2 (two) times daily. 08/30/14 10/08/17  Shelva Majestic, MD  KLOR-CON M20 20 MEQ tablet TAKE 1 TABLET (20 MEQ TOTAL) BY MOUTH DAILY. 05/25/14   Lindley Magnus, MD  metoprolol (TOPROL-XL) 200 MG 24 hr tablet TAKE 1 TABLET DAILY 10/30/14   Shelva Majestic, MD  tadalafil (CIALIS) 20 MG tablet Take 0.5-1 tablets (10-20 mg total) by mouth every other day as needed for erectile dysfunction. 08/30/14   Shelva Majestic, MD  telmisartan (MICARDIS) 80 MG tablet TAKE 1 TABLET DAILY 10/30/14   Shelva Majestic, MD  telmisartan-hydrochlorothiazide (MICARDIS HCT) 80-25 MG per tablet Take 1 tablet by mouth daily. 08/30/14   Shelva Majestic, MD   BP 196/92 mmHg  Pulse 67  Temp(Src) 98.1 F (36.7 C) (Oral)  Resp 20  Ht  (1.905 m)  Wt 319 lb (144.697 kg)  BMI 39.87 kg/m2  SpO2 96% Physical Exam  Constitutional: He is oriented to person, place, and time. He appears well-developed and well-nourished.  HENT:  Right Ear: External ear normal.  Left Ear: External ear normal.  Laceration to the left cheek  Eyes: Conjunctivae and EOM  are normal. Pupils are equal, round, and reactive to light.  Neck: Normal range of motion. Neck supple.  Cardiovascular: Normal rate and regular rhythm.   Pulmonary/Chest: Effort normal and breath sounds normal.  Abdominal: Soft. Bowel sounds are normal.  Musculoskeletal: Normal range of motion.  Neurological: He is alert and oriented to person, place, and time.  Skin:  Laceration across the left cheek  Nursing note and vitals reviewed.   ED Course  LACERATION REPAIR Date/Time: 11/17/2014 7:56 AM Performed by: Teressa Lower Authorized by: Teressa Lower Consent: Verbal consent obtained. Consent given by: patient Patient identity confirmed: verbally with patient Body area: head/neck Location details: left cheek Laceration length: 3.5 cm Foreign bodies: no  foreign bodies Anesthesia: local infiltration Local anesthetic: lidocaine 1% with epinephrine Irrigation solution: tap water Skin closure: 6-0 Prolene Number of sutures: 7 Technique: simple Approximation: close Approximation difficulty: simple Patient tolerance: Patient tolerated the procedure well with no immediate complications   (including critical care time) Labs Review Labs Reviewed - No data to display  Imaging Review Ct Head Wo Contrast  11/17/2014   CLINICAL DATA:  Fall from bed this morning. Left facial laceration. Remote stroke. Hypertension.  EXAM: CT HEAD WITHOUT CONTRAST  TECHNIQUE: Contiguous axial images were obtained from the base of the skull through the vertex without intravenous contrast.  COMPARISON:  01/01/2009 ; facial CT from 11/17/2014  FINDINGS: Remote right frontoparietal infarcts at the vertex, not appreciably changed 2010.  At the level of the left lateral ventricle zone above, there is a region of white matter and cortical hypodensity compatible with stroke measuring approximately 2.8 by 0.9 cm. This has a generally chronic appearance.  Small remote lacunar infarct in the right periventricular white matter, image 18 series 201.  The brainstem, cerebellum, cerebral peduncles, thalami, basal ganglia, basilar cisterns, and ventricular system appear unremarkable. No intracranial hemorrhage, mass lesion, or acute CVA.  Hematoma and gas noted overlying the left upper maxilla and zygomatic arch region. There is atherosclerotic calcification of the cavernous carotid arteries bilaterally.  IMPRESSION: 1. No acute intracranial findings. Remote infarcts in the right frontoparietal convexity and left frontal lobe. Remote right periventricular white matter lacunar infarct. 2. Left facial hematoma and laceration.   Electronically Signed   By: Gaylyn Rong M.D.   On: 11/17/2014 08:55   Ct Maxillofacial Wo Cm  11/17/2014   CLINICAL DATA:  Patient fell off bed. Laceration along  the left cheek.  EXAM: CT MAXILLOFACIAL WITHOUT CONTRAST  TECHNIQUE: Multidetector CT imaging of the maxillofacial structures was performed. Multiplanar CT image reconstructions were also generated. A small metallic BB was placed on the right temple in order to reliably differentiate right from left.  COMPARISON:  Brain CT 01/01/2009  FINDINGS: Large amount of soft tissue swelling and hematoma overlying left maxilla. Findings are compatible with soft tissue laceration with associated hematoma and gas. No definite evidence for radiopaque foreign body. The globes are unremarkable bilaterally. Regional hypoattenuation within the high left frontal lobe, incompletely visualized. Right periventricular lacunar infarct. Paranasal sinuses are unremarkable. Zygomatic arches appear intact. Mandible appears intact. Maxilla is intact. Nasal bone is unremarkable. Mastoid air cells are well aerated. Mild rightward deviation nasal septum. Pterygoid plates unremarkable. Upper cervical spine degenerative changes.  IMPRESSION: Regional hypoattenuation within the high left frontal lobe is incompletely evaluated and may represent age-indeterminate infarct. Recommend correlation with dedicated brain CT.  Soft tissue laceration and hematoma within the soft tissues overlying the left maxilla.  No evidence for acute maxillofacial fracture.  Electronically Signed   By: Annia Beltrew  Davis M.D.   On: 11/17/2014 08:03     EKG Interpretation None      MDM   Final diagnoses:  Fall  Facial laceration, initial encounter    Pt has not had his bp medication today. No bony abnormalities in face. Pt is to follow up with pcp for suture removed. Pt neurologically intact. Will send home with hydrocodone for pain    Teressa LowerVrinda Jordon Kristiansen, NP 11/17/14 [id number]  Tomasita CrumbleAdeleke Oni, MD 11/17/14 [id number]

## 2014-11-17 NOTE — Discharge Instructions (Signed)
Have the sutures removed in 5 days with your doctors office Facial Laceration  A facial laceration is a cut on the face. These injuries can be painful and cause bleeding. Lacerations usually heal quickly, but they need special care to reduce scarring. DIAGNOSIS  Your health care provider will take a medical history, ask for details about how the injury occurred, and examine the wound to determine how deep the cut is. TREATMENT  Some facial lacerations may not require closure. Others may not be able to be closed because of an increased risk of infection. The risk of infection and the chance for successful closure will depend on various factors, including the amount of time since the injury occurred. The wound may be cleaned to help prevent infection. If closure is appropriate, pain medicines may be given if needed. Your health care provider will use stitches (sutures), wound glue (adhesive), or skin adhesive strips to repair the laceration. These tools bring the skin edges together to allow for faster healing and a better cosmetic outcome. If needed, you may also be given a tetanus shot. HOME CARE INSTRUCTIONS  Only take over-the-counter or prescription medicines as directed by your health care provider.  Follow your health care provider's instructions for wound care. These instructions will vary depending on the technique used for closing the wound. For Sutures:  Keep the wound clean and dry.   If you were given a bandage (dressing), you should change it at least once a day. Also change the dressing if it becomes wet or dirty, or as directed by your health care provider.   Wash the wound with soap and water 2 times a day. Rinse the wound off with water to remove all soap. Pat the wound dry with a clean towel.   After cleaning, apply a thin layer of the antibiotic ointment recommended by your health care provider. This will help prevent infection and keep the dressing from sticking.   You  may shower as usual after the first 24 hours. Do not soak the wound in water until the sutures are removed.   Get your sutures removed as directed by your health care provider. With facial lacerations, sutures should usually be taken out after 4-5 days to avoid stitch marks.   Wait a few days after your sutures are removed before applying any makeup. For Skin Adhesive Strips:  Keep the wound clean and dry.   Do not get the skin adhesive strips wet. You may bathe carefully, using caution to keep the wound dry.   If the wound gets wet, pat it dry with a clean towel.   Skin adhesive strips will fall off on their own. You may trim the strips as the wound heals. Do not remove skin adhesive strips that are still stuck to the wound. They will fall off in time.  For Wound Adhesive:  You may briefly wet your wound in the shower or bath. Do not soak or scrub the wound. Do not swim. Avoid periods of heavy sweating until the skin adhesive has fallen off on its own. After showering or bathing, gently pat the wound dry with a clean towel.   Do not apply liquid medicine, cream medicine, ointment medicine, or makeup to your wound while the skin adhesive is in place. This may loosen the film before your wound is healed.   If a dressing is placed over the wound, be careful not to apply tape directly over the skin adhesive. This may cause the adhesive to  be pulled off before the wound is healed.   Avoid prolonged exposure to sunlight or tanning lamps while the skin adhesive is in place.  The skin adhesive will usually remain in place for 5-10 days, then naturally fall off the skin. Do not pick at the adhesive film.  After Healing: Once the wound has healed, cover the wound with sunscreen during the day for 1 full year. This can help minimize scarring. Exposure to ultraviolet light in the first year will darken the scar. It can take 1-2 years for the scar to lose its redness and to heal completely.    SEEK IMMEDIATE MEDICAL CARE IF:  You have redness, pain, or swelling around the wound.   You see ayellowish-white fluid (pus) coming from the wound.   You have chills or a fever.  MAKE SURE YOU:  Understand these instructions.  Will watch your condition.  Will get help right away if you are not doing well or get worse. Document Released: 06/04/2004 Document Revised: 02/15/2013 Document Reviewed: 12/08/2012 [facility] Patient Information 2015 [place], [place]. This information is not intended to replace advice given to you by your health care provider. Make sure you discuss any questions you have with your health care provider.

## 2014-11-17 NOTE — ED Notes (Signed)
Pt fell from his bed and hit on night stand and hit his face, pt having a laceration on left side of his face.

## 2014-11-23 ENCOUNTER — Ambulatory Visit (INDEPENDENT_AMBULATORY_CARE_PROVIDER_SITE_OTHER): Payer: BLUE CROSS/BLUE SHIELD | Admitting: Adult Health

## 2014-11-23 VITALS — BP 160/80 | Temp 97.9°F | Ht 75.0 in | Wt 303.3 lb

## 2014-11-23 DIAGNOSIS — Z4802 Encounter for removal of sutures: Secondary | ICD-10-CM

## 2014-11-23 NOTE — Progress Notes (Signed)
Pre visit review using our clinic review tool, if applicable. No additional management support is needed unless otherwise documented below in the visit note. 

## 2014-11-23 NOTE — Progress Notes (Signed)
Subjective:    Patient ID: Andre Thompson, male    DOB: 1946/03/10, 69 y.o.   MRN: [id number]  HPI  69 year old male here for suture removal of 7 sutures - left cheek. Was seen in the ER on 11/17/2014 after falling out of bed, causing a laceration to face. His wound appears well healed, no signs of infection.   Review of Systems  Constitutional: Negative for fever.  Skin: Positive for wound.  All other systems reviewed and are negative.  Past Medical History  Diagnosis Date  . Gout   . Hyperlipidemia   . Hypertension   . Stroke   . Rhabdomyolysis   . DIVERTICULOSIS, COLON 04/30/2009    Qualifier: Diagnosis of  By: Tawanna Cooler RN, Alvino Chapel      History   Social History  . Marital Status: Married    Spouse Name: N/A  . Number of Children: N/A  . Years of Education: N/A   Occupational History  . Not on file.   Social History Main Topics  . Smoking status: Never Smoker   . Smokeless tobacco: Not on file  . Alcohol Use: 7.2 oz/week    12 Standard drinks or equivalent per week  . Drug Use: No  . Sexual Activity: Not on file   Other Topics Concern  . Not on file   Social History Narrative   Married (wife patient elsewhere) about 10 years in 2016, 2 step children, 6 step grandkids.       Retired from [employer]      Hobbies: golfing, basketball    Past Surgical History  Procedure Laterality Date  . Left ankle surgery      ligament reattached    Family History  Problem Relation Age of Onset  . Hypertension Mother   . Hypertension Father   . Hypertension Sister     4 sisters  . Hypertension Brother     3 brothers  . Cancer Brother     intestine    Allergies  Allergen Reactions  . Ace Inhibitors     REACTION: angioedema--8/09 (on lotrel)  . Chlorthalidone     REACTION: gout    Current Outpatient Prescriptions on File Prior to Visit  Medication Sig Dispense Refill  . amLODipine (NORVASC) 10 MG tablet Take 1 tablet (10 mg total) by  mouth daily. 90 tablet 3  . aspirin 81 MG tablet Take 81 mg by mouth daily.      . colchicine (COLCRYS) 0.6 MG tablet TAKE 1 TABLET TWICE A DAY prn 180 tablet 1  . febuxostat (ULORIC) 40 MG tablet Take 1 tablet (40 mg total) by mouth daily. 90 tablet 3  . hydrALAZINE (APRESOLINE) 50 MG tablet Take 1 tablet (50 mg total) by mouth 2 (two) times daily. 180 tablet 3  . metoprolol (TOPROL-XL) 200 MG 24 hr tablet TAKE 1 TABLET DAILY 90 tablet 1  . tadalafil (CIALIS) 20 MG tablet Take 0.5-1 tablets (10-20 mg total) by mouth every other day as needed for erectile dysfunction. 15 tablet 3  . telmisartan (MICARDIS) 80 MG tablet TAKE 1 TABLET DAILY 90 tablet 1  . atorvastatin (LIPITOR) 40 MG tablet Take 1 tablet (40 mg total) by mouth daily. (Patient not taking: Reported on 11/23/2014) 90 tablet 3  . HYDROcodone-acetaminophen (NORCO/VICODIN) 5-325 MG per tablet Take 1-2 tablets by mouth every 6 (six) hours as needed. (Patient not taking: Reported on 11/23/2014) 10 tablet 0  . KLOR-CON M20 20 MEQ tablet TAKE  1 TABLET (20 MEQ TOTAL) BY MOUTH DAILY. (Patient not taking: Reported on 11/23/2014) 90 tablet 1  . telmisartan-hydrochlorothiazide (MICARDIS HCT) 80-25 MG per tablet Take 1 tablet by mouth daily. (Patient not taking: Reported on 11/23/2014) 90 tablet 3   No current facility-administered medications on file prior to visit.    BP 160/80 mmHg  Temp(Src) 97.9 F (36.6 C) (Oral)  Ht 6\' 3"  (1.905 m)  Wt 303 lb 4.8 oz (137.576 kg)  BMI 37.91 kg/m2       Objective:   Physical Exam  Constitutional: He is oriented to person, place, and time. He appears well-developed and well-nourished. No distress.  Musculoskeletal: Normal range of motion.  Neurological: He is alert and oriented to person, place, and time.  Skin: Skin is warm and dry. He is not diaphoretic.  Well healed wound with no signs or symptoms of infection on left cheek. 7 simple sutures noted.   Psychiatric: He has a normal mood and affect. His  behavior is normal. Judgment and thought content normal.  Nursing note and vitals reviewed.      Assessment & Plan:  1. Visit for suture removal 7 sutures removed.  - ABX ointment given  - apply sun screen and wear a hat - Vitamin E to help heal wound.  - Follow up with any signs of infection.

## 2014-11-29 ENCOUNTER — Ambulatory Visit (INDEPENDENT_AMBULATORY_CARE_PROVIDER_SITE_OTHER): Payer: BLUE CROSS/BLUE SHIELD | Admitting: Family Medicine

## 2014-11-29 ENCOUNTER — Encounter: Payer: Self-pay | Admitting: Family Medicine

## 2014-11-29 VITALS — BP 140/84 | HR 51 | Temp 97.8°F | Wt 306.0 lb

## 2014-11-29 DIAGNOSIS — Z1211 Encounter for screening for malignant neoplasm of colon: Secondary | ICD-10-CM

## 2014-11-29 DIAGNOSIS — E785 Hyperlipidemia, unspecified: Secondary | ICD-10-CM | POA: Diagnosis not present

## 2014-11-29 DIAGNOSIS — I10 Essential (primary) hypertension: Secondary | ICD-10-CM

## 2014-11-29 MED ORDER — PRAVASTATIN SODIUM 40 MG PO TABS: 40.0000 mg | ORAL_TABLET | Freq: Every day | ORAL | Status: DC

## 2014-11-29 MED ORDER — POTASSIUM CHLORIDE CRYS ER 20 MEQ PO TBCR: EXTENDED_RELEASE_TABLET | ORAL | Status: DC

## 2014-11-29 MED ORDER — TELMISARTAN-HCTZ 80-25 MG PO TABS: 1.0000 | ORAL_TABLET | Freq: Every day | ORAL | Status: DC

## 2014-11-29 NOTE — Progress Notes (Signed)
Tana Conch, MD  Subjective:  Andre Thompson is a 69 y.o. year old very pleasant male patient who presents with:  Hypertension-mild poor control off hctz portion of telmisartan-hct  BP Readings from Last 3 Encounters:  11/29/14 140/84  11/23/14 160/80  11/17/14 191/117  Compliant with medications-yes, except switched back to telmisartan from telmisartan-hct for "weird feeling" he had a hard time describing.  ROS-Denies any CP (outside of that listed below), HA, SOB, blurry vision, LE edema  Hyperlipidemia-assume poor control Atorvastatin- felt flutters in chest in past on 20mg . At 40mg , reported a constantchest pressure so stopped and issue Stopped as soon as he stopped medication Lab Results  Component Value Date   LDLCALC 125* 02/25/2009   On statin: no ROS- no chest pain or shortness of breath. No myalgias  Past Medical History- history CVA around 1995, CKD III, gout  Medications- reviewed and updated Current Outpatient Prescriptions  Medication Sig Dispense Refill  . amLODipine (NORVASC) 10 MG tablet Take 1 tablet (10 mg total) by mouth daily. 90 tablet 3  . aspirin 81 MG tablet Take 81 mg by mouth daily.      . colchicine (COLCRYS) 0.6 MG tablet TAKE 1 TABLET TWICE A DAY prn 180 tablet 1  . febuxostat (ULORIC) 40 MG tablet Take 1 tablet (40 mg total) by mouth daily. 90 tablet 3  . hydrALAZINE (APRESOLINE) 50 MG tablet Take 1 tablet (50 mg total) by mouth 2 (two) times daily. 180 tablet 3  . metoprolol (TOPROL-XL) 200 MG 24 hr tablet TAKE 1 TABLET DAILY 90 tablet 1  . telmisartan (MICARDIS) 80 MG tablet TAKE 1 TABLET DAILY 90 tablet 1  . KLOR-CON M20 20 MEQ tablet TAKE 1 TABLET (20 MEQ TOTAL) BY MOUTH DAILY. (Patient not taking: Reported on 11/23/2014) 90 tablet 1  . tadalafil (CIALIS) 20 MG tablet Take 0.5-1 tablets (10-20 mg total) by mouth every other day as needed for erectile dysfunction. (Patient not taking: Reported on 11/29/2014) 15 tablet 3  .  telmisartan-hydrochlorothiazide (MICARDIS HCT) 80-25 MG per tablet Take 1 tablet by mouth daily. (Patient not taking: Reported on 11/23/2014) 90 tablet 3   Objective: BP 140/84 mmHg  Pulse 51  Temp(Src) 97.8 F (36.6 C)  Wt 306 lb (138.801 kg) Gen: NAD, resting comfortably CV: RRR no murmurs rubs or gallops (not brady on my exam) Lungs: CTAB no crackles, wheeze, rhonchi Abdomen: soft/nontender/nondistended/normal bowel sounds. No rebound or guarding.  Ext: trace edema under compression stockings Skin: warm, dry, healing wound on left cheek- no signs infection Neuro: grossly normal, moves all extremities   Assessment/Plan:  Essential hypertension Mild poor control on Amlodipine 10mg , hydralazine 50mg  BID , telmisartan80mg , metoprolol 200mg  XL. Add back HCTZ as well as his potassium and we will recheck BP and potassium in a few weeks.   Hyperlipidemia Suspect poor control given stopping statin due to Odd side effect of palpations and even chest pressure on atorvastatin. Hold medicine for now. Ordered pravastatin as alternate but hold off on starting until see if tolerates hctz.   High risk patient with history of CVA. Needs improved lipid and BP control.   See AVS  Meds ordered this encounter  Medications  . telmisartan-hydrochlorothiazide (MICARDIS HCT) 80-25 MG per tablet    Sig: Take 1 tablet by mouth daily.    Dispense:  90 tablet    Refill:  3  . potassium chloride SA (KLOR-CON M20) 20 MEQ tablet    Sig: TAKE 1 TABLET (20 MEQ TOTAL) BY  MOUTH DAILY.    Dispense:  90 tablet    Refill:  3  . pravastatin (PRAVACHOL) 40 MG tablet    Sig: Take 1 tablet (40 mg total) by mouth daily.    Dispense:  90 tablet    Refill:  3

## 2014-11-29 NOTE — Assessment & Plan Note (Signed)
Suspect poor control given stopping statin due to Odd side effect of palpations and even chest pressure on atorvastatin. Hold medicine for now. Ordered pravastatin as alternate but hold off on starting until see if tolerates hctz.

## 2014-11-29 NOTE — Patient Instructions (Addendum)
Blood pressure- up slightly 1. Continue amlodipine, metoprolol, hydralazine 2. Stop telmisartan by itself 3. Retart telmisartan-hct 4. Restart potassium  Check in 4-6 weeks and we will see if you are having any side effects from the blood pressure medicine changes.   I sent in pravastatin for cholesterol but I want you to hold off on taking this until we see how you do with the blood pressure medicine change.

## 2014-11-29 NOTE — Assessment & Plan Note (Signed)
Mild poor control on Amlodipine , hydralazine  BID , telmisartan80mg , metoprolol  XL. Add back HCTZ as well as his potassium and we will recheck BP and potassium in a few weeks.

## 2015-01-10 ENCOUNTER — Encounter: Payer: Self-pay | Admitting: Family Medicine

## 2015-01-10 ENCOUNTER — Ambulatory Visit (INDEPENDENT_AMBULATORY_CARE_PROVIDER_SITE_OTHER): Payer: BLUE CROSS/BLUE SHIELD | Admitting: Family Medicine

## 2015-01-10 VITALS — BP 138/82 | HR 50 | Temp 97.5°F | Wt 307.0 lb

## 2015-01-10 DIAGNOSIS — I10 Essential (primary) hypertension: Secondary | ICD-10-CM

## 2015-01-10 DIAGNOSIS — E785 Hyperlipidemia, unspecified: Secondary | ICD-10-CM | POA: Diagnosis not present

## 2015-01-10 LAB — BASIC METABOLIC PANEL
BUN: 24 mg/dL — AB (ref 6–23)
CO2: 29 meq/L (ref 19–32)
CREATININE: 1.79 mg/dL — AB (ref 0.40–1.50)
Calcium: 9.8 mg/dL (ref 8.4–10.5)
Chloride: 103 mEq/L (ref 96–112)
GFR: 48.58 mL/min — ABNORMAL LOW (ref >60.00)
GLUCOSE: 76 mg/dL (ref 70–99)
Potassium: 4.3 mEq/L (ref 3.5–5.1)
Sodium: 139 mEq/L (ref 135–145)

## 2015-01-10 NOTE — Progress Notes (Signed)
Tana Conch, MD  Subjective:  Andre Thompson is a 69 y.o. year old very pleasant male patient who presents for/with See problem oriented charting ROS- no chest pain, shortness of breath, no changes in urination pattern, no headache or palpitations  Past Medical History-  Patient Active Problem List   Diagnosis Date Noted  . History of stroke 03/07/2007    Priority: High  . CKD (chronic kidney disease), stage III 12/15/2012    Priority: Medium  . Hyperlipidemia 03/07/2007    Priority: Medium  . Gout 03/07/2007    Priority: Medium  . Essential hypertension 03/07/2007    Priority: Medium  . Irregular heart beat 08/31/2014    Priority: Low  . Erectile dysfunction 04/18/2014    Priority: Low  . Venous (peripheral) insufficiency 07/15/2007    Priority: Low    Medications- reviewed and updated Current Outpatient Prescriptions  Medication Sig Dispense Refill  . amLODipine (NORVASC) 10 MG tablet Take 1 tablet (10 mg total) by mouth daily. 90 tablet 3  . aspirin 81 MG tablet Take 81 mg by mouth daily.      . colchicine (COLCRYS) 0.6 MG tablet TAKE 1 TABLET TWICE A DAY prn 180 tablet 1  . febuxostat (ULORIC) 40 MG tablet Take 1 tablet (40 mg total) by mouth daily. 90 tablet 3  . hydrALAZINE (APRESOLINE) 50 MG tablet Take 1 tablet (50 mg total) by mouth 2 (two) times daily. 180 tablet 3  . metoprolol (TOPROL-XL) 200 MG 24 hr tablet TAKE 1 TABLET DAILY 90 tablet 1  . potassium chloride SA (KLOR-CON M20) 20 MEQ tablet TAKE 1 TABLET (20 MEQ TOTAL) BY MOUTH DAILY. 90 tablet 3  . pravastatin (PRAVACHOL) 40 MG tablet Take 1 tablet (40 mg total) by mouth daily. 90 tablet 3  . telmisartan-hydrochlorothiazide (MICARDIS HCT) 80-25 MG per tablet Take 1 tablet by mouth daily. 90 tablet 3  . tadalafil (CIALIS) 20 MG tablet Take 0.5-1 tablets (10-20 mg total) by mouth every other day as needed for erectile dysfunction. (Patient not taking: Reported on 11/29/2014) 15 tablet 3   No current  facility-administered medications for this visit.    Objective: BP 138/82 mmHg  Pulse 50  Temp(Src) 97.5 F (36.4 C)  Wt 307 lb (139.254 kg) Gen: NAD, resting comfortably CV: occasional ectopic beat, no murmurs rubs or gallops Lungs: CTAB no crackles, wheeze, rhonchi Abdomen: soft/nontender/nondistended/normal bowel sounds. No rebound or guarding.  Ext: 1+ edema under stockings  Skin: warm, dry Neuro: grossly normal, moves all extremities  Assessment/Plan:  Essential hypertension S: controlled. On Amlodipine , hydralazine  BID , telmisartan80mg -hydrochlorothiazide , metoprolol  XL.  A/P:Continue current meds. Though technically at goal discussed with CVA history would like to push lower closer to 120/80. On extensive list of meds so we discussed working on healthy lifestyle choices- increased exercise and dash diet. Potassium in normal range- so continue meds including potassium supplement despite ARB   Hyperlipidemia S: did not tolerate atorvastatin- palpitations and even chest pressure. He has tolerated restart HCTZ with no SE A/P: start pravastatin    3-4 month f/u  Orders Placed This Encounter  Procedures  . Basic metabolic panel    [place]

## 2015-01-10 NOTE — Patient Instructions (Signed)
Blood pressure at goal with restart of combo pill.  Let's work on regular exercise-goal 150 minutes a week 5-10 lbs weight loss and healthy eating 3-4 month follow up     DASH Eating Plan DASH stands for "Dietary Approaches to Stop Hypertension." The DASH eating plan is a healthy eating plan that has been shown to reduce high blood pressure (hypertension). Additional health benefits may include reducing the risk of type 2 diabetes mellitus, heart disease, and stroke. The DASH eating plan may also help with weight loss. WHAT DO I NEED TO KNOW ABOUT THE DASH EATING PLAN? For the DASH eating plan, you will follow these general guidelines:  Choose foods with a percent daily value for sodium of less than 5% (as listed on the food label).  Use salt-free seasonings or herbs instead of table salt or sea salt.  Check with your health care provider or pharmacist before using salt substitutes.  Eat lower-sodium products, often labeled as "lower sodium" or "no salt added."  Eat fresh foods.  Eat more vegetables, fruits, and low-fat dairy products.  Choose whole grains. Look for the word "whole" as the first word in the ingredient list.  Choose fish and skinless chicken or [place] more often than red meat. Limit fish, poultry, and meat to 6 oz (170 g) each day.  Limit sweets, desserts, sugars, and sugary drinks.  Choose heart-healthy fats.  Limit cheese to 1 oz (28 g) per day.  Eat more home-cooked food and less restaurant, buffet, and fast food.  Limit fried foods.  Cook foods using methods other than frying.  Limit canned vegetables. If you do use them, rinse them well to decrease the sodium.  When eating at a restaurant, ask that your food be prepared with less salt, or no salt if possible. WHAT FOODS CAN I EAT? Seek help from a dietitian for individual calorie needs. Grains Whole grain or whole wheat bread. Brown rice. Whole grain or whole wheat pasta. Quinoa, bulgur, and whole  grain cereals. Low-sodium cereals. Corn or whole wheat flour tortillas. Whole grain cornbread. Whole grain crackers. Low-sodium crackers. Vegetables Fresh or frozen vegetables (raw, steamed, roasted, or grilled). Low-sodium or reduced-sodium tomato and vegetable juices. Low-sodium or reduced-sodium tomato sauce and paste. Low-sodium or reduced-sodium canned vegetables.  Fruits All fresh, canned (in natural juice), or frozen fruits. Meat and Other Protein Products Ground beef (85% or leaner), grass-fed beef, or beef trimmed of fat. Skinless chicken or [place]. Ground chicken or [place]. Pork trimmed of fat. All fish and seafood. Eggs. Dried beans, peas, or lentils. Unsalted nuts and seeds. Unsalted canned beans. Dairy Low-fat dairy products, such as skim or 1% milk, 2% or reduced-fat cheeses, low-fat ricotta or cottage cheese, or plain low-fat yogurt. Low-sodium or reduced-sodium cheeses. Fats and Oils Tub margarines without trans fats. Light or reduced-fat mayonnaise and salad dressings (reduced sodium). Avocado. Safflower, olive, or canola oils. Natural peanut or almond butter. Other Unsalted popcorn and pretzels. The items listed above may not be a complete list of recommended foods or beverages. Contact your dietitian for more options. WHAT FOODS ARE NOT RECOMMENDED? Grains White bread. White pasta. White rice. Refined cornbread. Bagels and croissants. Crackers that contain trans fat. Vegetables Creamed or fried vegetables. Vegetables in a cheese sauce. Regular canned vegetables. Regular canned tomato sauce and paste. Regular tomato and vegetable juices. Fruits Dried fruits. Canned fruit in light or heavy syrup. Fruit juice. Meat and Other Protein Products Fatty cuts of meat. Ribs, chicken wings, bacon, sausage,  bologna, salami, chitterlings, fatback, hot dogs, bratwurst, and packaged luncheon meats. Salted nuts and seeds. Canned beans with salt. Dairy Whole or 2% milk, cream, half-and-half,  and cream cheese. Whole-fat or sweetened yogurt. Full-fat cheeses or blue cheese. Nondairy creamers and whipped toppings. Processed cheese, cheese spreads, or cheese curds. Condiments Onion and garlic salt, seasoned salt, table salt, and sea salt. Canned and packaged gravies. Worcestershire sauce. Tartar sauce. Barbecue sauce. Teriyaki sauce. Soy sauce, including reduced sodium. Steak sauce. Fish sauce. Oyster sauce. Cocktail sauce. Horseradish. Ketchup and mustard. Meat flavorings and tenderizers. Bouillon cubes. Hot sauce. Tabasco sauce. Marinades. Taco seasonings. Relishes. Fats and Oils Butter, stick margarine, lard, shortening, ghee, and bacon fat. Coconut, palm kernel, or palm oils. Regular salad dressings. Other Pickles and olives. Salted popcorn and pretzels. The items listed above may not be a complete list of foods and beverages to avoid. Contact your dietitian for more information. WHERE CAN I FIND MORE INFORMATION? National Heart, Lung, and Blood Institute: [email or web address] Document Released: 04/16/2011 Document Revised: 09/11/2013 Document Reviewed: 03/01/2013 [facility] Patient Information 2015 [place], [place]. This information is not intended to replace advice given to you by your health care provider. Make sure you discuss any questions you have with your health care provider.

## 2015-01-10 NOTE — Assessment & Plan Note (Signed)
S: controlled. On Amlodipine , hydralazine  BID , telmisartan80mg -hydrochlorothiazide , metoprolol  XL.  A/P:Continue current meds. Though technically at goal discussed with CVA history would like to push lower closer to 120/80. On extensive list of meds so we discussed working on healthy lifestyle choices- increased exercise and dash diet. Potassium in normal range- so continue meds including potassium supplement despite ARB

## 2015-01-10 NOTE — Assessment & Plan Note (Signed)
S: did not tolerate atorvastatin- palpitations and even chest pressure. He has tolerated restart HCTZ with no SE A/P: start pravastatin 

## 2015-04-01 ENCOUNTER — Other Ambulatory Visit: Payer: Self-pay | Admitting: Internal Medicine

## 2015-06-03 ENCOUNTER — Encounter: Payer: Self-pay | Admitting: Family Medicine

## 2015-06-03 ENCOUNTER — Ambulatory Visit (INDEPENDENT_AMBULATORY_CARE_PROVIDER_SITE_OTHER): Payer: BLUE CROSS/BLUE SHIELD | Admitting: Family Medicine

## 2015-06-03 VITALS — BP 128/84 | HR 56 | Temp 97.8°F | Wt 320.0 lb

## 2015-06-03 DIAGNOSIS — N183 Chronic kidney disease, stage 3 unspecified: Secondary | ICD-10-CM

## 2015-06-03 DIAGNOSIS — I10 Essential (primary) hypertension: Secondary | ICD-10-CM | POA: Diagnosis not present

## 2015-06-03 DIAGNOSIS — E785 Hyperlipidemia, unspecified: Secondary | ICD-10-CM | POA: Diagnosis not present

## 2015-06-03 LAB — COMPREHENSIVE METABOLIC PANEL
ALT: 28 U/L (ref 0–53)
AST: 30 U/L (ref 0–37)
Albumin: 4.5 g/dL (ref 3.5–5.2)
Alkaline Phosphatase: 103 U/L (ref 39–117)
BUN: 25 mg/dL — AB (ref 6–23)
CALCIUM: 9.4 mg/dL (ref 8.4–10.5)
CHLORIDE: 104 meq/L (ref 96–112)
CO2: 27 mEq/L (ref 19–32)
Creatinine, Ser: 1.75 mg/dL — ABNORMAL HIGH (ref 0.40–1.50)
GFR: 49.81 mL/min — ABNORMAL LOW (ref >60.00)
Glucose, Bld: 94 mg/dL (ref 70–99)
POTASSIUM: 3.9 meq/L (ref 3.5–5.1)
Sodium: 140 mEq/L (ref 135–145)
Total Bilirubin: 0.9 mg/dL (ref 0.2–1.2)
Total Protein: 7.7 g/dL (ref 6.0–8.3)

## 2015-06-03 LAB — LDL CHOLESTEROL, DIRECT: Direct LDL: 113 mg/dL

## 2015-06-03 NOTE — Patient Instructions (Signed)
Wt Readings from Last 3 Encounters:  06/03/15 320 lb (145.151 kg)  01/10/15 307 lb (139.254 kg)  11/29/14 306 lb (138.801 kg)  You know what you need to do. Bump exercise. Eat regular smaller meals.   Update labs before you leave  No changes in medicine at present

## 2015-06-03 NOTE — Progress Notes (Signed)
Tana Conch, MD  Subjective:  Andre Thompson is a 70 y.o. year old very pleasant male patient who presents for/with See problem oriented charting ROS- No chest pain or shortness of breath. No headache or blurry vision. No recent gout attacks  Past Medical History-  Patient Active Problem List   Diagnosis Date Noted  . History of stroke 03/07/2007    Priority: High  . CKD (chronic kidney disease), stage III 12/15/2012    Priority: Medium  . Hyperlipidemia 03/07/2007    Priority: Medium  . Gout 03/07/2007    Priority: Medium  . Essential hypertension 03/07/2007    Priority: Medium  . Irregular heart beat 08/31/2014    Priority: Low  . Erectile dysfunction 04/18/2014    Priority: Low  . Venous (peripheral) insufficiency 07/15/2007    Priority: Low    Medications- reviewed and updated Current Outpatient Prescriptions  Medication Sig Dispense Refill  . amLODipine (NORVASC) 10 MG tablet Take 1 tablet (10 mg total) by mouth daily. 90 tablet 3  . aspirin 81 MG tablet Take 81 mg by mouth daily.      . febuxostat (ULORIC) 40 MG tablet Take 1 tablet (40 mg total) by mouth daily. 90 tablet 3  . hydrALAZINE (APRESOLINE) 50 MG tablet Take 1 tablet (50 mg total) by mouth 2 (two) times daily. 180 tablet 3  . metoprolol (TOPROL-XL) 200 MG 24 hr tablet TAKE 1 TABLET DAILY 90 tablet 1  . potassium chloride SA (KLOR-CON M20) 20 MEQ tablet TAKE 1 TABLET (20 MEQ TOTAL) BY MOUTH DAILY. 90 tablet 3  . pravastatin (PRAVACHOL) 40 MG tablet Take 1 tablet (40 mg total) by mouth daily. 90 tablet 3  . telmisartan-hydrochlorothiazide (MICARDIS HCT) 80-25 MG per tablet Take 1 tablet by mouth daily. 90 tablet 3  . ULORIC 40 MG tablet TAKE 1 TABLET DAILY 90 tablet 3  . colchicine (COLCRYS) 0.6 MG tablet TAKE 1 TABLET TWICE A DAY prn (Patient not taking: Reported on 06/03/2015) 180 tablet 1  . tadalafil (CIALIS) 20 MG tablet Take 0.5-1 tablets (10-20 mg total) by mouth every other day as needed for erectile  dysfunction. (Patient not taking: Reported on 11/29/2014) 15 tablet 3   No current facility-administered medications for this visit.    Objective: BP 128/84 mmHg  Pulse 56  Temp(Src) 97.8 F (36.6 C)  Wt 320 lb (145.151 kg) Gen: NAD, resting comfortably CV: RRR no murmurs rubs or gallops Lungs: CTAB no crackles, wheeze, rhonchi Abdomen: soft/nontender/nondistended/normal bowel sounds. No rebound or guarding.  Ext: 1+ edema even under compression stockings Skin: warm, dry Neuro: grossly normal, moves all extremities   Assessment/Plan:  Hyperlipidemia S: suspect improved controll on pravastatin , last labs were on no rx- stopped atorvastatin due to myalgias. No myalgias at present Lab Results  Component Value Date   CHOL 150 06/01/2013   HDL 43.70 06/01/2013   LDLCALC 125* 02/25/2009   LDLDIRECT 154.0 08/30/2014   TRIG 284.0* 06/01/2013   CHOLHDL 3 06/01/2013   A/P: LDL goal <100 but definitely will push for <70- increase to  if not at goal. Weight up 13 lbs- stressed weight loss   Essential hypertension S: controlled. On Amlodipine , hydralazine  BID , telmisartan80mg -hydrochlorothiazide , metoprolol  XL. Also takes potassium  BP Readings from Last 3 Encounters:  06/03/15 128/84  01/10/15 138/82  11/29/14 140/84  A/P:Continue current meds:  Make sure potassium still not elevated- may have to take off eventually but last was 3.5 on hctz even  with telmisartan on board   CKD (chronic kidney disease), stage III S: GFR in 40s normally. On ARB in case proteinuric. Did have slight improvement off hctz but BP was poorly controlled A/P: repeat GFR today as well as potassium Does have some symptoms of BPH nocturia 3-4x a night but otherwise good stream- doubt this is contributing to CKD. Will consider psa and rectal at follow up cpe    Return in about 6 months (around 12/01/2015) for physical. Return precautions advised.  Declines hep C testing- aware  of risks  Orders Placed This Encounter  Procedures  . LDL cholesterol, direct    [place]  . Comprehensive metabolic panel    [place]

## 2015-06-03 NOTE — Assessment & Plan Note (Signed)
S: controlled. On Amlodipine , hydralazine  BID , telmisartan80mg -hydrochlorothiazide , metoprolol  XL. Also takes potassium  BP Readings from Last 3 Encounters:  06/03/15 128/84  01/10/15 138/82  11/29/14 140/84  A/P:Continue current meds:  Make sure potassium still not elevated- may have to take off eventually but last was 3.5 on hctz even with telmisartan on board

## 2015-06-03 NOTE — Assessment & Plan Note (Addendum)
S: suspect improved controll on pravastatin , last labs were on no rx- stopped atorvastatin due to myalgias. No myalgias at present Lab Results  Component Value Date   CHOL 150 06/01/2013   HDL 43.70 06/01/2013   LDLCALC 125* 02/25/2009   LDLDIRECT 154.0 08/30/2014   TRIG 284.0* 06/01/2013   CHOLHDL 3 06/01/2013   A/P: LDL goal <100 but definitely will push for <70- increase to  if not at goal. Weight up 13 lbs- stressed weight loss

## 2015-06-03 NOTE — Assessment & Plan Note (Signed)
S: GFR in 40s normally. On ARB in case proteinuric. Did have slight improvement off hctz but BP was poorly controlled A/P: repeat GFR today as well as potassium Does have some symptoms of BPH nocturia 3-4x a night but otherwise good stream- doubt this is contributing to CKD. Will consider psa and rectal at follow up cpe

## 2015-06-04 ENCOUNTER — Other Ambulatory Visit: Payer: Self-pay

## 2015-06-04 MED ORDER — PRAVASTATIN SODIUM 80 MG PO TABS
80.0000 mg | ORAL_TABLET | Freq: Every day | ORAL | Status: DC
Start: 2015-06-04 — End: 2015-12-18

## 2015-07-17 ENCOUNTER — Other Ambulatory Visit: Payer: Self-pay | Admitting: Family Medicine

## 2015-11-08 ENCOUNTER — Telehealth: Payer: Self-pay | Admitting: Family Medicine

## 2015-11-08 NOTE — Telephone Encounter (Addendum)
Pt request refill   amLODipine (NORVASC) 10 MG tablet colchicine (COLCRYS) 0.6 MG tablet ULORIC 40 MG tablet hydrALAZINE (APRESOLINE) 50 MG tablet metoprolol (TOPROL-XL) 200 MG 24 hr tablet pravastatin (PRAVACHOL) 80 MG tablet telmisartan-hydrochlorothiazide (MICARDIS HCT) 80-25 MG per tablet potassium chloride SA (KLOR-CON M20) 20 MEQ tablet  Pt has changed mail order pharmacies and needs all these rx sent to  [facility]

## 2015-11-11 ENCOUNTER — Other Ambulatory Visit: Payer: Self-pay

## 2015-11-11 DIAGNOSIS — I10 Essential (primary) hypertension: Secondary | ICD-10-CM

## 2015-11-11 MED ORDER — HYDRALAZINE HCL 50 MG PO TABS: 50.0000 mg | ORAL_TABLET | Freq: Two times a day (BID) | ORAL | Status: DC

## 2015-11-11 MED ORDER — AMLODIPINE BESYLATE 10 MG PO TABS: 10.0000 mg | ORAL_TABLET | Freq: Every day | ORAL | Status: AC

## 2015-11-11 MED ORDER — COLCHICINE 0.6 MG PO TABS: ORAL_TABLET | ORAL | Status: AC

## 2015-11-11 MED ORDER — POTASSIUM CHLORIDE CRYS ER 20 MEQ PO TBCR: EXTENDED_RELEASE_TABLET | ORAL | Status: AC

## 2015-11-11 MED ORDER — TELMISARTAN-HCTZ 80-25 MG PO TABS: 1.0000 | ORAL_TABLET | Freq: Every day | ORAL | Status: DC

## 2015-11-11 MED ORDER — METOPROLOL SUCCINATE ER 200 MG PO TB24: 200.0000 mg | ORAL_TABLET | Freq: Every day | ORAL | Status: DC

## 2015-11-11 MED ORDER — FEBUXOSTAT 40 MG PO TABS: 40.0000 mg | ORAL_TABLET | Freq: Every day | ORAL | Status: DC

## 2015-11-11 MED ORDER — TELMISARTAN-HCTZ 80-25 MG PO TABS: 1.0000 | ORAL_TABLET | Freq: Every day | ORAL | Status: AC

## 2015-11-11 NOTE — Telephone Encounter (Signed)
Called patient and left a message to confirm pharmacy

## 2015-11-11 NOTE — Telephone Encounter (Signed)
[facility]eds ordered from [facility] per patient request.

## 2015-11-26 ENCOUNTER — Other Ambulatory Visit (INDEPENDENT_AMBULATORY_CARE_PROVIDER_SITE_OTHER): Payer: Medicare Other

## 2015-11-26 DIAGNOSIS — Z0000 Encounter for general adult medical examination without abnormal findings: Secondary | ICD-10-CM | POA: Diagnosis not present

## 2015-11-26 LAB — CBC WITH DIFFERENTIAL/PLATELET
Basophils Absolute: 0 10*3/uL (ref 0.0–0.1)
Basophils Relative: 0.7 % (ref 0.0–3.0)
EOS ABS: 0.3 10*3/uL (ref 0.0–0.7)
Eosinophils Relative: 6.9 % — ABNORMAL HIGH (ref 0.0–5.0)
HCT: 45.7 % (ref 39.0–52.0)
HEMOGLOBIN: 15.6 g/dL (ref 13.0–17.0)
Lymphocytes Relative: 26.5 % (ref 12.0–46.0)
Lymphs Abs: 1.3 10*3/uL (ref 0.7–4.0)
MCHC: 34.1 g/dL (ref 30.0–36.0)
MCV: 90.9 fl (ref 78.0–100.0)
MONO ABS: 0.5 10*3/uL (ref 0.1–1.0)
Monocytes Relative: 9.4 % (ref 3.0–12.0)
Neutro Abs: 2.9 10*3/uL (ref 1.4–7.7)
Neutrophils Relative %: 56.5 % (ref 43.0–77.0)
Platelets: 197 10*3/uL (ref 150.0–400.0)
RBC: 5.02 Mil/uL (ref 4.22–5.81)
RDW: 14.2 % (ref 11.5–15.5)
WBC: 5.1 10*3/uL (ref 4.0–10.5)

## 2015-11-26 LAB — LIPID PANEL
CHOL/HDL RATIO: 4
Cholesterol: 183 mg/dL (ref 0–200)
HDL: 44.7 mg/dL (ref >39.00)
LDL CALC: 108 mg/dL — AB (ref 0–99)
NonHDL: 138.34
TRIGLYCERIDES: 152 mg/dL — AB (ref 0.0–149.0)
VLDL: 30.4 mg/dL (ref 0.0–40.0)

## 2015-11-26 LAB — POC URINALSYSI DIPSTICK (AUTOMATED)
BILIRUBIN UA: NEGATIVE
Blood, UA: NEGATIVE
GLUCOSE UA: NEGATIVE
KETONES UA: NEGATIVE
LEUKOCYTES UA: NEGATIVE
Nitrite, UA: NEGATIVE
PH UA: 5
Spec Grav, UA: >=1.03
Urobilinogen, UA: 0.2

## 2015-11-26 LAB — BASIC METABOLIC PANEL
BUN: 26 mg/dL — AB (ref 6–23)
CALCIUM: 9.2 mg/dL (ref 8.4–10.5)
CO2: 29 mEq/L (ref 19–32)
CREATININE: 2.05 mg/dL — AB (ref 0.40–1.50)
Chloride: 103 mEq/L (ref 96–112)
GFR: 41.44 mL/min — AB (ref >60.00)
Glucose, Bld: 93 mg/dL (ref 70–99)
Potassium: 3.8 mEq/L (ref 3.5–5.1)
Sodium: 140 mEq/L (ref 135–145)

## 2015-11-26 LAB — HEPATIC FUNCTION PANEL
ALT: 22 U/L (ref 0–53)
AST: 27 U/L (ref 0–37)
Albumin: 4.2 g/dL (ref 3.5–5.2)
Alkaline Phosphatase: 90 U/L (ref 39–117)
BILIRUBIN DIRECT: 0.1 mg/dL (ref 0.0–0.3)
BILIRUBIN TOTAL: 0.8 mg/dL (ref 0.2–1.2)
Total Protein: 7.3 g/dL (ref 6.0–8.3)

## 2015-11-26 LAB — TSH: TSH: 0.9 u[IU]/mL (ref 0.35–4.50)

## 2015-11-26 LAB — PSA: PSA: 0.55 ng/mL (ref 0.10–4.00)

## 2015-12-03 ENCOUNTER — Encounter: Payer: BLUE CROSS/BLUE SHIELD | Admitting: Family Medicine

## 2015-12-18 ENCOUNTER — Other Ambulatory Visit: Payer: Self-pay

## 2015-12-18 ENCOUNTER — Encounter: Payer: Self-pay | Admitting: Family Medicine

## 2015-12-18 ENCOUNTER — Ambulatory Visit (INDEPENDENT_AMBULATORY_CARE_PROVIDER_SITE_OTHER): Payer: PRIVATE HEALTH INSURANCE | Admitting: Family Medicine

## 2015-12-18 VITALS — BP 162/98 | HR 55 | Temp 98.0°F | Ht 74.25 in | Wt 313.4 lb

## 2015-12-18 DIAGNOSIS — M109 Gout, unspecified: Secondary | ICD-10-CM

## 2015-12-18 DIAGNOSIS — E785 Hyperlipidemia, unspecified: Secondary | ICD-10-CM

## 2015-12-18 DIAGNOSIS — I10 Essential (primary) hypertension: Secondary | ICD-10-CM

## 2015-12-18 DIAGNOSIS — N183 Chronic kidney disease, stage 3 unspecified: Secondary | ICD-10-CM

## 2015-12-18 DIAGNOSIS — Z1211 Encounter for screening for malignant neoplasm of colon: Secondary | ICD-10-CM

## 2015-12-18 DIAGNOSIS — Z0000 Encounter for general adult medical examination without abnormal findings: Secondary | ICD-10-CM

## 2015-12-18 MED ORDER — HYDRALAZINE HCL 50 MG PO TABS: 50.0000 mg | ORAL_TABLET | Freq: Three times a day (TID) | ORAL | 12 refills | Status: AC

## 2015-12-18 MED ORDER — PRAVASTATIN SODIUM 80 MG PO TABS: 80.0000 mg | ORAL_TABLET | Freq: Every day | ORAL | 3 refills | Status: DC

## 2015-12-18 NOTE — Telephone Encounter (Signed)
Key: [id number]

## 2015-12-18 NOTE — Assessment & Plan Note (Signed)
S: mild poor controlled on pravastatin 40mg  (has 80mg  pill but takes 40mg  most of time and No myalgias but has myalgias on 80mg  dose Lab Results  Component Value Date   CHOL 183 11/26/2015   HDL 44.70 11/26/2015   LDLCALC 108 (H) 11/26/2015   LDLDIRECT 113.0 06/03/2015   TRIG 152.0 (H) 11/26/2015   CHOLHDL 4 11/26/2015   A/P: wish we could titrate to pravastatin 80mg  but patient cannot tolerate- continue 40mg  dosing (half pill of 80mg )

## 2015-12-18 NOTE — Progress Notes (Addendum)
Phone: [phone]  Subjective:  Patient presents today for their annual wellness visit.    Preventive Screening-Counseling & Management  Smoking Status: Never Smoker Second Hand Smoking status: No smokers in home  Risk Factors Regular exercise: no, advised to start Diet: has beneworking on improving and has lost weight as result  Fall Risk: None  Fall Risk  12/18/2015 11/29/2014 10/25/2013  Falls in the past year? No No No    Cardiac risk factors:  advanced age (older than 33 for men, 22 for women)  Hyperlipidemia - mild, cannot get to higher dose of medicine due to myalgias No diabetes.  Hypertension- controlled in past   Depression Screen None. PHQ2 0  Depression screen [facility] 2/9 12/18/2015 11/29/2014 10/25/2013  Decreased Interest 0 0 0  Down, Depressed, Hopeless 0 0 0  PHQ - 2 Score 0 0 0    Activities of Daily Living Independent ADLs and IADLs   Hearing Difficulties: -patient declines  Cognitive Testing No reported trouble.   Normal 3 word recall  List the Names of Other Physician/Practitioners you currently use: -Dr. Lawerance Bach optho  Immunization History  Administered Date(s) Administered  . Pneumococcal Conjugate-13 08/30/2014  . Pneumococcal Polysaccharide-23 09/01/2010  . Tdap 09/01/2010  . Zoster 08/28/2010   Required Immunizations needed today - flu, declines  Screening tests- up to date Health Maintenance Due  Topic Date Due  . COLONOSCOPY - ordered today 09/27/2014    ROS- No pertinent positives discovered in course of AWV Pertinent ROS- No chest pain or shortness of breath. No headache or blurry vision.    The following were reviewed and entered/updated in epic: Past Medical History:  Diagnosis Date  . DIVERTICULOSIS, COLON 04/30/2009   Qualifier: Diagnosis of  By: Everett Graff    . Gout   . Hyperlipidemia   . Hypertension   . Rhabdomyolysis   . Stroke [facility])    Patient Active Problem List   Diagnosis Date Noted  . History  of stroke 03/07/2007    Priority: High  . CKD (chronic kidney disease), stage III 12/15/2012    Priority: Medium  . Hyperlipidemia 03/07/2007    Priority: Medium  . Gout 03/07/2007    Priority: Medium  . Essential hypertension 03/07/2007    Priority: Medium  . Irregular heart beat 08/31/2014    Priority: Low  . Erectile dysfunction 04/18/2014    Priority: Low  . Venous (peripheral) insufficiency 07/15/2007    Priority: Low   Past Surgical History:  Procedure Laterality Date  . left ankle surgery     ligament reattached    Family History  Problem Relation Age of Onset  . Hypertension Mother   . Hypertension Father   . Hypertension Sister     4 sisters  . Hypertension Brother     3 brothers  . Cancer Brother     intestine    Medications- reviewed and updated Current Outpatient Prescriptions  Medication Sig Dispense Refill  . amLODipine (NORVASC) 10 MG tablet Take 1 tablet (10 mg total) by mouth daily. 90 tablet 3  . aspirin 81 MG tablet Take 81 mg by mouth daily.      . colchicine (COLCRYS) 0.6 MG tablet TAKE 1 TABLET TWICE A DAY prn 180 tablet 1  . febuxostat (ULORIC) 40 MG tablet Take 1 tablet (40 mg total) by mouth daily. 90 tablet 3  . hydrALAZINE (APRESOLINE) 50 MG tablet Take 1 tablet (50 mg total) by mouth 3 (three) times daily. 270 tablet 12  .  metoprolol (TOPROL-XL) 200 MG 24 hr tablet Take 1 tablet (200 mg total) by mouth daily. 90 tablet 1  . potassium chloride SA (KLOR-CON M20) 20 MEQ tablet TAKE 1 TABLET (20 MEQ TOTAL) BY MOUTH DAILY. 90 tablet 3  . pravastatin (PRAVACHOL) 80 MG tablet Take 1 tablet (80 mg total) by mouth daily. 90 tablet 3  . tadalafil (CIALIS) 20 MG tablet Take 0.5-1 tablets (10-20 mg total) by mouth every other day as needed for erectile dysfunction. 15 tablet 3  . telmisartan-hydrochlorothiazide (MICARDIS HCT) 80-25 MG tablet Take 1 tablet by mouth daily. 90 tablet 3   No current facility-administered medications for this visit.      Allergies-reviewed and updated Allergies  Allergen Reactions  . Ace Inhibitors     REACTION: angioedema--8/09 (on lotrel)  . Chlorthalidone     REACTION: gout    Social History   Social History  . Marital status: Married    Spouse name: N/A  . Number of children: N/A  . Years of education: N/A   Social History Main Topics  . Smoking status: Never Smoker  . Smokeless tobacco: None  . Alcohol use 7.2 oz/week    12 Standard drinks or equivalent per week  . Drug use: No  . Sexual activity: Not Asked   Other Topics Concern  . None   Social History Narrative   Married (wife patient elsewhere) about 10 years in 2016, 2 step children, 6 step grandkids.       Retired from [employer]: golfing, basketball    Objective: BP (!) 162/98 (BP Location: Left Arm, Patient Position: Sitting, Cuff Size: Large)   Pulse (!) 55   Temp 98 F (36.7 C) (Oral)   Ht 6' 2.25" (1.886 m)   Wt (!) 313 lb 6.4 oz (142.2 kg)   SpO2 97%   BMI 39.97 kg/m  Gen: NAD, resting comfortably HEENT: Mucous membranes are moist. Oropharynx normal Neck: no thyromegaly CV: RRR no murmurs rubs or gallops Lungs: CTAB no crackles, wheeze, rhonchi Abdomen: soft/nontender/nondistended/normal bowel sounds. No rebound or guarding.  Ext: no edema Skin: warm, dry Neuro: grossly normal, moves all extremities, PERRLA  Assessment/Plan:  AWV completed- discussed recommended screenings anddocumented any personalized health advice and referrals for preventive counseling. See AVS as well which was given to patient.   Status of chronic or acute concerns   Hyperlipidemia S: mild poor controlled on pravastatin  (has  pill but takes  most of time and No myalgias but has myalgias on  dose Lab Results  Component Value Date   CHOL 183 11/26/2015   HDL 44.70 11/26/2015   LDLCALC 108 (H) 11/26/2015   LDLDIRECT 113.0 06/03/2015   TRIG 152.0 (H) 11/26/2015    CHOLHDL 4 11/26/2015   A/P: wish we could titrate to pravastatin  but patient cannot tolerate- continue  dosing (half pill of )  Gout S: has bene on uloric - no flares even while on hctz. Prior auth needed- patient approximately 10 years ago was on allopurinol but failed to control uric acid and continued to have flares A/P: submitted PA for uloric today and approved. Continue uloric and had colchicine on hand but does not use  Essential hypertension S: poorly controlled. On amlodipine , hydralazine  BID, telmisartan HCT 80-25mg  and metoprolol  XL. Still has to be on potassium BP Readings from Last 3 Encounters:  12/18/15 (!) 162/98  06/03/15 128/84  01/10/15 138/82  A/P:Continue current meds:  But titrate hydralazine to TID and follow up within 6 weeks  CKD (chronic kidney disease), stage III S: GFR stable to slightly worse. Needs tighter BP control- hhad improvement in GFR off hctz but BP spiked A/P: continue current medication therapy  Needs colonoscopy- may have issues because needs step kids in town Orders Placed This Encounter  Procedures  . Ambulatory referral to Gastroenterology    Referral Priority:   Routine    Referral Type:   Consultation    Referral Reason:   Specialty Services Required    Number of Visits Requested:   1    Meds ordered this encounter  Medications  . pravastatin (PRAVACHOL) 80 MG tablet    Sig: Take 1 tablet (80 mg total) by mouth daily.    Dispense:  90 tablet    Refill:  3  . hydrALAZINE (APRESOLINE) 50 MG tablet    Sig: Take 1 tablet (50 mg total) by mouth 3 (three) times daily.    Dispense:  270 tablet    Refill:  12    Return precautions advised.  Tana ConchStephen Darrold Bezek, MD

## 2015-12-18 NOTE — Assessment & Plan Note (Addendum)
S: poorly controlled. On amlodipine 10mg , hydralazine 50mg  BID, telmisartan HCT 80-25mg  and metoprolol 200mg  XL. Still has to be on potassium BP Readings from Last 3 Encounters:  12/18/15 (!) 162/98  06/03/15 128/84  01/10/15 138/82  A/P:Continue current meds:  But titrate hydralazine to TID and follow up within 6 weeks

## 2015-12-18 NOTE — Progress Notes (Signed)
Pre visit review using our clinic review tool, if applicable. No additional management support is needed unless otherwise documented below in the visit note. 

## 2015-12-18 NOTE — Telephone Encounter (Signed)
Prior Authorization completed for febuxostat (ULORIC) 40 MG tablet  PA Case [id number] is Approved. For further questions, call [phone].

## 2015-12-18 NOTE — Patient Instructions (Addendum)
  [name] , Thank you for taking time to come for your Medicare Wellness Visit. I appreciate your ongoing commitment to your health goals. Please review the following plan we discussed and let me know if I can assist you in the future.   These are the goals we discussed: 1. We will call you within a week about your referral to GI for colonoscopy. If you do not hear within 2 weeks, give us a call.  2. Increase hydralazine to 50mg  three times a day, return in 1 month   This is a list of the screening recommended for you and due dates:  Health Maintenance  Topic Date Due  . Colon Cancer Screening  09/27/2014  . Flu Shot  06/02/2065*  .  Hepatitis C: One time screening is recommended by Center for Disease Control  (CDC) for  adults born from [birth year] through 1965.   05/23/2109*  . Tetanus Vaccine  08/31/2020  . Shingles Vaccine  Completed  . Pneumonia vaccines  Completed  *Topic was postponed. The date shown is not the original due date.

## 2015-12-18 NOTE — Assessment & Plan Note (Signed)
S: GFR stable to slightly worse. Needs tighter BP control- hhad improvement in GFR off hctz but BP spiked A/P: continue current medication therapy

## 2015-12-18 NOTE — Assessment & Plan Note (Signed)
S: has bene on uloric - no flares even while on hctz. Prior auth needed- patient approximately 10 years ago was on allopurinol but failed to control uric acid and continued to have flares A/P: submitted PA for uloric today and approved. Continue uloric and had colchicine on hand but does not use

## 2015-12-19 ENCOUNTER — Encounter: Payer: Self-pay | Admitting: Family Medicine

## 2015-12-19 ENCOUNTER — Telehealth: Payer: Self-pay

## 2015-12-19 NOTE — Telephone Encounter (Signed)
Prior Authorization approval was received for febuxostat (ULORIC) 40 MG tablet  Approved through 12/31.2017 under Medicare Part D benefit.   [id number]

## 2016-01-22 ENCOUNTER — Encounter: Payer: Self-pay | Admitting: Family Medicine

## 2016-01-22 ENCOUNTER — Ambulatory Visit (INDEPENDENT_AMBULATORY_CARE_PROVIDER_SITE_OTHER): Payer: PRIVATE HEALTH INSURANCE | Admitting: Family Medicine

## 2016-01-22 VITALS — BP 140/88 | HR 53 | Temp 97.7°F | Wt 324.0 lb

## 2016-01-22 DIAGNOSIS — I10 Essential (primary) hypertension: Secondary | ICD-10-CM | POA: Diagnosis not present

## 2016-01-22 DIAGNOSIS — Z1211 Encounter for screening for malignant neoplasm of colon: Secondary | ICD-10-CM

## 2016-01-22 DIAGNOSIS — M109 Gout, unspecified: Secondary | ICD-10-CM | POA: Diagnosis not present

## 2016-01-22 MED ORDER — FEBUXOSTAT 80 MG PO TABS: 1.0000 | ORAL_TABLET | Freq: Every day | ORAL | 5 refills | Status: AC

## 2016-01-22 MED ORDER — PRAVASTATIN SODIUM 40 MG PO TABS: 40.0000 mg | ORAL_TABLET | Freq: Every day | ORAL | 3 refills | Status: AC

## 2016-01-22 NOTE — Progress Notes (Signed)
Pre visit review using our clinic review tool, if applicable. No additional management support is needed unless otherwise documented below in the visit note. 

## 2016-01-22 NOTE — Assessment & Plan Note (Signed)
S: I had mistakenly thought patient was taking colchicine just prn and was not taking really- he states today he actually has been taking it daily instead of the prn noted. He tried to stop a few weeks ago and left hand became very painful in several joints so restarted and stopped A/P: last year uric acid was 6.6 and did not adjust because thought not having flare son uloric 40mg  alone- since he is taking chronically we will work to stop this by increasing uloric to 80mg  and then following up about 2 months from now- stop colchicine daily after a month.   Also see avs for detailed isntructions

## 2016-01-22 NOTE — Assessment & Plan Note (Signed)
S: controlled on amlodipine 10mg , hydralazine 50mg  daily, telmisartan HCT 80-25mg , and metoprolol 200mg  XL. Has to be on potassium despite ARB  Patient was supposed to change hydralazine to TID, when he got home- realized was only taking once daily so increased to twice a day but somehow developed Left shoulder pain if took hydralazine BID. Took 3 days off and tried again and had same issue- only 4 hours apart though BP Readings from Last 3 Encounters:  01/22/16 140/88  12/18/15 (!) 162/98  06/03/15 128/84  A/P:Continue current meds:  But trial BID further separated- breakfast and dinner. Really would prefer to stop hctz with gout history but concern would be for worsening hypertension when already on extended regimen. On my repeat bp was 148/88

## 2016-01-22 NOTE — Patient Instructions (Signed)
Increase uloric to 80mg . Continue colchicine for 1 month then stop colchicine one month from now.   For blood pressure in about 6 weeks- add the hydralazine 2nd dose in at dinner to see if you still have side effects  See me 8 weeks/2 months  Pick up stool cards from labs and send those back to us

## 2016-01-22 NOTE — Progress Notes (Signed)
Subjective:  Andre Thompson is a 70 y.o. year old very pleasant male patient who presents for/with See problem oriented charting ROS- no exertional chest or shortness of breath, no headache or blurry vision.see any ROS included in HPI as well.   Past Medical History-  Patient Active Problem List   Diagnosis Date Noted  . History of stroke 03/07/2007    Priority: High  . CKD (chronic kidney disease), stage III 12/15/2012    Priority: Medium  . Hyperlipidemia 03/07/2007    Priority: Medium  . Gout 03/07/2007    Priority: Medium  . Essential hypertension 03/07/2007    Priority: Medium  . Irregular heart beat 08/31/2014    Priority: Low  . Erectile dysfunction 04/18/2014    Priority: Low  . Venous (peripheral) insufficiency 07/15/2007    Priority: Low    Medications- reviewed and updated Current Outpatient Prescriptions  Medication Sig Dispense Refill  . amLODipine (NORVASC) 10 MG tablet Take 1 tablet (10 mg total) by mouth daily. 90 tablet 3  . aspirin 81 MG tablet Take 81 mg by mouth daily.      . colchicine (COLCRYS) 0.6 MG tablet TAKE 1 TABLET TWICE A DAY prn 180 tablet 1  . hydrALAZINE (APRESOLINE) 50 MG tablet Take 1 tablet (50 mg total) by mouth 3 (three) times daily. 270 tablet 12  . metoprolol (TOPROL-XL) 200 MG 24 hr tablet Take 1 tablet (200 mg total) by mouth daily. 90 tablet 1  . potassium chloride SA (KLOR-CON M20) 20 MEQ tablet TAKE 1 TABLET (20 MEQ TOTAL) BY MOUTH DAILY. 90 tablet 3  . pravastatin (PRAVACHOL) 80 MG tablet Take 1 tablet (80 mg total) by mouth daily. 90 tablet 3  . tadalafil (CIALIS) 20 MG tablet Take 0.5-1 tablets (10-20 mg total) by mouth every other day as needed for erectile dysfunction. 15 tablet 3  . telmisartan-hydrochlorothiazide (MICARDIS HCT) 80-25 MG tablet Take 1 tablet by mouth daily. 90 tablet 3  . Febuxostat 40 MG TABS Take 1 tablet (40 mg total) by mouth daily. 30 tablet 5   Objective: BP 140/88 (BP Location: Left Arm, Patient  Position: Sitting, Cuff Size: Large)   Pulse (!) 53   Temp 97.7 F (36.5 C) (Oral)   Wt (!) 324 lb (147 kg)   SpO2 94%   BMI 41.32 kg/m  Gen: NAD, resting comfortably CV: RRR no murmurs rubs or gallops Lungs: CTAB no crackles, wheeze, rhonchi Abdomen: soft/nontender/nondistended/normal bowel sounds. Obese Ext: no edema Skin: warm, dry Neuro: grossly normal, moves all extremities  Assessment/Plan:  Essential hypertension S: controlled on amlodipine 10mg , hydralazine 50mg  daily, telmisartan HCT 80-25mg , and metoprolol 200mg  XL. Has to be on potassium despite ARB  Patient was supposed to change hydralazine to TID, when he got home- realized was only taking once daily so increased to twice a day but somehow developed Left shoulder pain if took hydralazine BID. Took 3 days off and tried again and had same issue- only 4 hours apart though BP Readings from Last 3 Encounters:  01/22/16 140/88  12/18/15 (!) 162/98  06/03/15 128/84  A/P:Continue current meds:  But trial BID further separated- breakfast and dinner. Really would prefer to stop hctz with gout history but concern would be for worsening hypertension when already on extended regimen. On my repeat bp was 148/88  Gout S: I had mistakenly thought patient was taking colchicine just prn and was not taking really- he states today he actually has been taking it daily instead of the  prn noted. He tried to stop a few weeks ago and left hand became very painful in several joints so restarted and stopped A/P: last year uric acid was 6.6 and did not adjust because thought not having flare son uloric 40mg  alone- since he is taking chronically we will work to stop this by increasing uloric to 80mg  and then following up about 2 months from now- stop colchicine daily after a month.   Also see avs for detailed isntructions  possible daily colchicine is what is making myalgias more likely on statin as well  2 months  Pick up stool cards from  labs and send those back to us for colon cancer screening as no one to sit with him for colonoscopy Orders Placed This Encounter  Procedures  . POC Hemoccult Bld/Stl (3-Cd Home Screen)    Send home    Standing Status:   Future    Standing Expiration Date:   01/21/2017    Meds ordered this encounter  Medications  . Febuxostat 80 MG TABS    Sig: Take 1 tablet (80 mg total) by mouth daily.    Dispense:  30 tablet    Refill:  5    Return precautions advised.  Tana ConchStephen Faustina Gebert, MD

## 2016-03-29 ENCOUNTER — Encounter: Payer: Self-pay | Admitting: Emergency Medicine

## 2016-04-08 ENCOUNTER — Ambulatory Visit: Payer: Medicare Other | Admitting: Family Medicine

## 2016-04-08 DIAGNOSIS — Z0289 Encounter for other administrative examinations: Secondary | ICD-10-CM

## 2016-04-10 DEATH — deceased

## 2016-04-27 ENCOUNTER — Other Ambulatory Visit: Payer: Self-pay | Admitting: Family Medicine
